# Patient Record
Sex: Male | Born: 1980 | Race: White | Hispanic: No | Marital: Single | Smoking: Current every day smoker
Health system: Southern US, Community
[De-identification: ages and names within clinical notes are randomized; demographics above are authoritative.]

## PROBLEM LIST (undated history)

## (undated) DIAGNOSIS — Z789 Other specified health status: Secondary | ICD-10-CM

## (undated) HISTORY — PX: NO PAST SURGERIES: SHX2092

---

## 2012-12-07 ENCOUNTER — Emergency Department: Payer: Self-pay | Admitting: Emergency Medicine

## 2015-09-19 ENCOUNTER — Encounter: Payer: Self-pay | Admitting: Emergency Medicine

## 2015-09-19 ENCOUNTER — Emergency Department
Admission: EM | Admit: 2015-09-19 | Discharge: 2015-09-19 | Disposition: A | Discharge status: Home / Self Care | Payer: Self-pay | Attending: Emergency Medicine | Admitting: Emergency Medicine

## 2015-09-19 DIAGNOSIS — K529 Noninfective gastroenteritis and colitis, unspecified: Secondary | ICD-10-CM

## 2015-09-19 DIAGNOSIS — R112 Nausea with vomiting, unspecified: Secondary | ICD-10-CM | POA: Insufficient documentation

## 2015-09-19 DIAGNOSIS — R109 Unspecified abdominal pain: Secondary | ICD-10-CM | POA: Insufficient documentation

## 2015-09-19 DIAGNOSIS — R197 Diarrhea, unspecified: Secondary | ICD-10-CM | POA: Insufficient documentation

## 2015-09-19 DIAGNOSIS — K921 Melena: Secondary | ICD-10-CM

## 2015-09-19 LAB — COMPREHENSIVE METABOLIC PANEL
ALT: 15 U/L — AB (ref 17–63)
AST: 15 U/L (ref 15–41)
Albumin: 4.4 g/dL (ref 3.5–5.0)
Alkaline Phosphatase: 68 U/L (ref 38–126)
Anion gap: 7 (ref 5–15)
BUN: 11 mg/dL (ref 6–20)
CHLORIDE: 105 mmol/L (ref 101–111)
CO2: 22 mmol/L (ref 22–32)
CREATININE: 0.76 mg/dL (ref 0.61–1.24)
Calcium: 8.7 mg/dL — ABNORMAL LOW (ref 8.9–10.3)
GFR calc Af Amer: >60 mL/min (ref >60)
GFR calc non Af Amer: >60 mL/min (ref >60)
Glucose, Bld: 115 mg/dL — ABNORMAL HIGH (ref 65–99)
POTASSIUM: 3.6 mmol/L (ref 3.5–5.1)
Sodium: 134 mmol/L — ABNORMAL LOW (ref 135–145)
Total Bilirubin: 0.8 mg/dL (ref 0.3–1.2)
Total Protein: 7.7 g/dL (ref 6.5–8.1)

## 2015-09-19 LAB — CBC
HEMATOCRIT: 48.3 % (ref 40.0–52.0)
Hemoglobin: 16.7 g/dL (ref 13.0–18.0)
MCH: 32.9 pg (ref 26.0–34.0)
MCHC: 34.6 g/dL (ref 32.0–36.0)
MCV: 95.3 fL (ref 80.0–100.0)
PLATELETS: 220 10*3/uL (ref 150–440)
RBC: 5.07 MIL/uL (ref 4.40–5.90)
RDW: 12.8 % (ref 11.5–14.5)
WBC: 13.7 10*3/uL — ABNORMAL HIGH (ref 3.8–10.6)

## 2015-09-19 LAB — LIPASE, BLOOD: Lipase: 18 U/L (ref 11–51)

## 2015-09-19 MED ORDER — SODIUM CHLORIDE 0.9 % IV BOLUS (SEPSIS)
1000.0000 mL | Freq: Once | INTRAVENOUS | Status: AC
  Administered 2015-09-19: 1000 mL via INTRAVENOUS

## 2015-09-19 MED ORDER — ONDANSETRON HCL 4 MG/2ML IJ SOLN
INTRAMUSCULAR | Status: AC
  Administered 2015-09-19: 4 mg via INTRAVENOUS
  Filled 2015-09-19: qty 2

## 2015-09-19 MED ORDER — ONDANSETRON HCL 4 MG/2ML IJ SOLN
4.0000 mg | Freq: Once | INTRAMUSCULAR | Status: AC
  Administered 2015-09-19: 4 mg via INTRAVENOUS

## 2015-09-19 MED ORDER — ONDANSETRON HCL 4 MG PO TABS: 4.0000 mg | ORAL_TABLET | Freq: Every day | ORAL | Status: DC | PRN

## 2015-09-19 NOTE — ED Notes (Signed)
A/o. Lungs clear. Heart sounds WNL

## 2015-09-19 NOTE — ED Provider Notes (Addendum)
[facility] Emergency Department Provider Note  ____________________________________________   I have reviewed the triage vital signs and the nursing notes.   HISTORY  Chief Complaint Abdominal Pain    HPI Joseph Elliott is a 35 y.o. male presents today with nausea vomiting diarrhea of one days duration. Patient had some mild abdominal cramping. No antibiotics no recent travel. Other sick contacts. He has no abdominal pain at this time. He states he did have a "trace" of blood and one bowel movement earlier. He denies any fever or chills. He felt fine yesterday. Not only blood thinners. No family history of ulcerative colitis that he knows of. No history of Crohn's disease. This is not a normal process for him.  Vomited a total of 4 times and had mostly watery diarrhea today. Only one episode of stool had any trace of blood in it    History reviewed. No pertinent past medical history.  There are no active problems to display for this patient.   History reviewed. No pertinent past surgical history.  No current outpatient prescriptions on file.  Allergies Review of patient's allergies indicates no known allergies.  No family history on file.  Social History Social History  Substance Use Topics  . Smoking status: Never Smoker   . Smokeless tobacco: None  . Alcohol Use: No    Review of Systems Constitutional: No fever/chills Eyes: No visual changes. ENT: No sore throat. No stiff neck no neck pain Cardiovascular: Denies chest pain. Respiratory: Denies shortness of breath. Gastrointestinal:  See history of present illness Genitourinary: Negative for dysuria. Musculoskeletal: Negative lower extremity swelling Skin: Negative for rash. Neurological: Negative for headaches, focal weakness or numbness. 10-point ROS otherwise negative.  ____________________________________________   PHYSICAL EXAM:  VITAL SIGNS: ED Triage Vitals  Enc  Vitals Group     BP 09/19/15 1409 136/71 mmHg     Pulse Rate 09/19/15 1409 85     Resp 09/19/15 1409 16     Temp 09/19/15 1409 97.7 F (36.5 C)     Temp Source 09/19/15 1409 Oral     SpO2 09/19/15 1409 99 %     Weight 09/19/15 1409 145 lb (65.772 kg)     Height 09/19/15 1409 5\' 5"  (1.651 m)     Head Cir --      Peak Flow --      Pain Score 09/19/15 1409 8     Pain Loc --      Pain Edu? --      Excl. in GC? --     Constitutional: Alert and oriented. Well appearing and in no acute distress. Eyes: Conjunctivae are normal. PERRL. EOMI. Head: Atraumatic. Nose: No congestion/rhinnorhea. Mouth/Throat: Mucous membranes are moist.  Oropharynx non-erythematous. Neck: No stridor.   Nontender with no meningismus Cardiovascular: Normal rate, regular rhythm. Grossly normal heart sounds.  Good peripheral circulation. Respiratory: Normal respiratory effort.  No retractions. Lungs CTAB. Abdominal: Soft and nontender. No distention. No guarding no rebound Back:  There is no focal tenderness or step off there is no midline tenderness there are no lesions noted. there is no CVA tenderness Musculoskeletal: No lower extremity tenderness. No joint effusions, no DVT signs strong distal pulses no edema Neurologic:  Normal speech and language. No gross focal neurologic deficits are appreciated.  Skin:  Skin is warm, dry and intact. No rash noted. Psychiatric: Mood and affect are normal. Speech and behavior are normal.  ____________________________________________   LABS (all labs ordered are listed,  but only abnormal results are displayed)  Labs Reviewed  COMPREHENSIVE METABOLIC PANEL - Abnormal; Notable for the following:    Sodium 134 (*)    Glucose, Bld 115 (*)    Calcium 8.7 (*)    ALT 15 (*)    All other components within normal limits  CBC - Abnormal; Notable for the following:    WBC 13.7 (*)    All other components within normal limits  LIPASE, BLOOD    ____________________________________________  EKG  I personally interpreted any EKGs ordered by me or triage  ____________________________________________  RADIOLOGY  I reviewed any imaging ordered by me or triage that were performed during my shift and, if possible, patient and/or family made aware of any abnormal findings. ____________________________________________   PROCEDURES  Procedure(s) performed: None  Critical Care performed: None  ____________________________________________   INITIAL IMPRESSION / ASSESSMENT AND PLAN / ED COURSE  Pertinent labs & imaging results that were available during my care of the patient were reviewed by me and considered in my medical decision making (see chart for details).  Very well-appearing gentleman with nausea vomiting diarrhea. Apparently there was some blood earlier but his hemoglobin is normal and his platelet count was normal he has no abdominal tenderness and he has no evidence of any significant intra-abdominal pathology such as diverticulosis etc. Vital signs are reassuring do not see any evidence of any significant GI bleed. We'll give the patient IV fluid and antiemetics and reassess. He will need follow-up with GI.  ----------------------------------------- 4:34 PM on 09/19/2015 -----------------------------------------  Patient feels much better, requesting a work note for tomorrow. We will discharge him at this time with close follow-up with GI because of the reported bleeding. However I do not think antibiotics are indicated at this time for one day of non-travelers diarrhea. Extensive return precautions including for bleeding given and listed. Patient declines rectal exam.  ----------------------------------------- 4:39 PM on 09/19/2015 -----------------------------------------  Patient is feeling much better at this time would like to go home. ____________________________________________   FINAL CLINICAL  IMPRESSION(S) / ED DIAGNOSES  Final diagnoses:  None      This chart was dictated using voice recognition software.  Despite best efforts to proofread,  errors can occur which can change meaning.     Jeanmarie Plant, MD 09/19/15 1526  Jeanmarie Plant, MD 09/19/15 1635  Jeanmarie Plant, MD 09/19/15 [phone]

## 2015-09-19 NOTE — Discharge Instructions (Signed)
Bloody Diarrhea ° ° °Bloody diarrhea can be caused by many different conditions. Most of the time bloody diarrhea is the result of food poisoning or minor infections. Bloody diarrhea usually improves over 2 to 3 days of rest and fluid replacement. Other conditions that can cause bloody diarrhea include: °· Internal bleeding. °· Infection. °· Diseases of the bowel and colon. °Internal bleeding from an ulcer or bowel disease can be severe and requires hospital care or even surgery. °DIAGNOSIS  °To find out what is wrong your caregiver may check your: °· Stool. °· Blood. °· Results from a test that looks inside the body (endoscopy). °TREATMENT  °· Get plenty of rest. °· Drink enough water and fluids to keep your urine clear or pale yellow. °· Do not smoke. °· Solid foods and dairy products should be avoided until your illness improves. °· As you improve, slowly return to a regular diet with easily-digested foods first. Examples are: °¨ Bananas. °¨ Rice. °¨ Toast. °¨ Crackers. °You should only need these for about 2 days before adding more normal foods to your diet. °· Avoid spicy or fatty foods as well as caffeine and alcohol for several days. °· Medicine to control cramping and diarrhea can relieve symptoms but may prolong some cases of bloody diarrhea. Antibiotics can speed recovery from diarrhea due to some bacterial infections. Call your caregiver if diarrhea does not get better in 3 days. °SEEK MEDICAL CARE IF:  °· You do not improve after 3 days. °· Your diarrhea improves but your stool appears black. °SEEK IMMEDIATE MEDICAL CARE IF:  °· You become extremely weak or faint. °· You become very sweaty. °· You have increased pain or bleeding. °· You develop repeated vomiting. °· You vomit and you see blood or the vomit looks black in color. °· You have a fever. °  °This information is not intended to replace advice given to you by your health care provider. Make sure you discuss any questions you have with your  health care provider. °  °Document Released: 05/21/2005 Document Revised: 06/11/2014 Document Reviewed: 04/22/2009 °Elsevier Interactive Patient Education ©2016 [employer] ° °

## 2015-09-19 NOTE — ED Notes (Signed)
Pt presents with abd pain, n/v/d with some rectal bleeding once this am .

## 2017-07-01 ENCOUNTER — Emergency Department
Admission: EM | Admit: 2017-07-01 | Discharge: 2017-07-01 | Disposition: A | Discharge status: Home / Self Care | Payer: Commercial Managed Care - PPO | Attending: Emergency Medicine | Admitting: Emergency Medicine

## 2017-07-01 ENCOUNTER — Encounter: Payer: Self-pay | Admitting: Intensive Care

## 2017-07-01 DIAGNOSIS — M436 Torticollis: Secondary | ICD-10-CM | POA: Insufficient documentation

## 2017-07-01 DIAGNOSIS — F17210 Nicotine dependence, cigarettes, uncomplicated: Secondary | ICD-10-CM | POA: Diagnosis not present

## 2017-07-01 DIAGNOSIS — M79602 Pain in left arm: Secondary | ICD-10-CM | POA: Diagnosis not present

## 2017-07-01 DIAGNOSIS — M542 Cervicalgia: Secondary | ICD-10-CM | POA: Diagnosis present

## 2017-07-01 MED ORDER — KETOROLAC TROMETHAMINE 10 MG PO TABS: 10.0000 mg | ORAL_TABLET | Freq: Three times a day (TID) | ORAL | 0 refills | Status: DC

## 2017-07-01 MED ORDER — METAXALONE 800 MG PO TABS
800.0000 mg | ORAL_TABLET | Freq: Once | ORAL | Status: AC
  Administered 2017-07-01: 800 mg via ORAL
  Filled 2017-07-01: qty 1

## 2017-07-01 MED ORDER — METAXALONE 800 MG PO TABS: 800.0000 mg | ORAL_TABLET | Freq: Three times a day (TID) | ORAL | 0 refills | Status: AC

## 2017-07-01 MED ORDER — KETOROLAC TROMETHAMINE 30 MG/ML IJ SOLN
30.0000 mg | Freq: Once | INTRAMUSCULAR | Status: AC
  Administered 2017-07-01: 30 mg via INTRAMUSCULAR
  Filled 2017-07-01: qty 1

## 2017-07-01 NOTE — ED Triage Notes (Signed)
Patient c/o sharp neck and L shoulder pain that radiates down L arm. Patient also states he has some pain in L chest area. Denies injury

## 2017-07-01 NOTE — Discharge Instructions (Signed)
Your exam is consistent with a muscle strain and spasm to the neck. This is causing some referred pain to the shoulder and arm. Take the prescription meds as directed. Apply moist heat to the neck and upper back. Follow-up with your provider or [facility] as needed.

## 2017-07-01 NOTE — ED Notes (Signed)
See triage note  Having some pain to neck and into left shoulder  Denies any injury  No deformity noted

## 2017-07-01 NOTE — ED Provider Notes (Signed)
[facility] Emergency Department Provider Note ____________________________________________  Time seen: 1322  I have reviewed the triage vital signs and the nursing notes.  HISTORY  Chief Complaint  Neck Pain and Shoulder Pain  HPI Joseph Elliott is a 37 y.o. male presents to the ED, accompanied by his wife, for evaluation of continued left neck pain and spasm.  Patient reports awakening Thursday morning with progressively worsening, left-sided neck pain and stiffness; while sitting down resting.  He is also noted some muscle pain referral to the upper shoulder and upper arm.  He also describes some mild, intermittent, numbness and tingling towards the hand.  He denies any injury, accident, or trauma.  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  He denies any diaphoresis, nausea, or vomiting.  He does report some referral of the shoulder pain into the upper anterior left chest.  He denies any shortness of breath, weakness, or dyspnea with exertion.  He reports his symptoms are improved with resting and limiting the movement of his left upper extremity.  He describes symptoms are worsened with use of the left arm and turning the neck.  He describes the pain is sharp in nature.  He has been evaluated by local urgent care the day after onset.  There he was told he had a muscle strain, and was placed on ibuprofen and Flexeril.  He has those medications as prescribed, but denies any significant benefit.  He presents here with his wife, for continued symptoms and some concern, on her part, that it may be his heart.  Patient otherwise has no significant medical history and takes no daily medications.  History reviewed. No pertinent past medical history.  There are no active problems to display for this patient.  History reviewed. No pertinent surgical history.  Prior to  Admission medications   Medication Sig Start Date End Date Taking? Authorizing Provider  ketorolac (TORADOL) 10 MG tablet Take 1 tablet (10 mg total) by mouth every 8 (eight) hours. 07/01/17   Dylana Shaw, Charlesetta IvoryJenise V Bacon, PA-C  metaxalone (SKELAXIN) 800 MG tablet Take 1 tablet (800 mg total) by mouth 3 (three) times daily for 10 days. 07/01/17 07/11/17  Mera Gunkel, Charlesetta IvoryJenise V Bacon, PA-C  ondansetron (ZOFRAN) 4 MG tablet Take 1 tablet (4 mg total) by mouth daily as needed for nausea or vomiting. 09/19/15   Jeanmarie PlantMcShane, James A, MD    Allergies Patient has no known allergies.  History reviewed. No pertinent family history.  Social History Social History   Tobacco Use  . Smoking status: Current Every Day Smoker    Types: Cigarettes  . Smokeless tobacco: Never Used  Substance Use Topics  . Alcohol use: Yes    Alcohol/week: 8.4 oz    Types: 14 Cans of beer per week  . Drug use: Yes    Types: Marijuana    Review of Systems  Constitutional: Negative for fever. Eyes: Negative for visual changes. ENT: Negative for sore throat. Cardiovascular: Negative for chest pain. Respiratory: Negative for shortness of breath. Gastrointestinal: Negative for abdominal pain, vomiting and diarrhea. Genitourinary: Negative for dysuria. Musculoskeletal: Negative for back pain.  Left neck and shoulder pain as above. Skin: Negative for rash. Neurological: Negative for headaches, focal weakness or numbness. ____________________________________________  PHYSICAL EXAM:  VITAL SIGNS: ED Triage Vitals  Enc Vitals Group     BP 07/01/17 1256 118/84  Pulse Rate 07/01/17 1256 87     Resp 07/01/17 1256 18     Temp 07/01/17 1256 98.3 F (36.8 C)     Temp Source 07/01/17 1256 Oral     SpO2 07/01/17 1256 98 %     Weight 07/01/17 1257 145 lb (65.8 kg)     Height 07/01/17 1257 5\' 5"  (1.651 m)     Head Circumference --      Peak Flow --      Pain Score 07/01/17 1257 10     Pain Loc --      Pain Edu? --      Excl. in  GC? --     Constitutional: Alert and oriented. Well appearing and in no distress. Head: Normocephalic and atraumatic. Eyes: Conjunctivae are normal. PERRL. Normal extraocular movements Ears: Canals clear. TMs intact bilaterally. Nose: No congestion/rhinorrhea/epistaxis. Mouth/Throat: Mucous membranes are moist. Neck: Supple. No thyromegaly.  Patient with tenderness to palpation over the left SCM musculature.  Patient also noted to have decreased right rotation and right lateral bending, both causing increased pain on the left side. Hematological/Lymphatic/Immunological: No cervical lymphadenopathy. Cardiovascular: Normal rate, regular rhythm. Normal distal pulses. Respiratory: Normal respiratory effort. No wheezes/rales/rhonchi. Musculoskeletal: Nontender with normal range of motion in all extremities.  Neurologic: Nerves II through XII grossly normal UE/LE DTRs bilaterally.  Intrinsic and opposition testing.  Normal gait without ataxia. Normal speech and language. No gross focal neurologic deficits are appreciated. Skin:  Skin is warm, dry and intact. No rash noted. Psychiatric: Mood and affect are normal. Patient exhibits appropriate insight and judgment. ____________________________________________   RADIOLOGY  Deferred ____________________________________________  EKG  NSR 77 bpm Incomplete RBBB Normal intervals No STEMI ____________________________________________  PROCEDURES  Procedures Toradol 30 mg IM Skelaxin 800 mg PO ____________________________________________  INITIAL IMPRESSION / ASSESSMENT AND PLAN / ED COURSE  DDX: cervical radiculopathy, ACS, rotator cuff tendinitis, cervical spine strain, torticollis  Patient with ED evaluation of continued left-sided neck and upper extremity pain.  Patient with a normal exam on presentation without signs of any acute neurological deficit, signs of ischemia, or arthropathy. His exam is consistent with acute torticollis. He  and his wife are reassured by his normal EKG. He will be discharged with a prescription for Toradol and Skelaxin. A work note is provided for 2 days, as requested. He will follow-up with [facility], as needed.  ____________________________________________  FINAL CLINICAL IMPRESSION(S) / ED DIAGNOSES  Final diagnoses:  Torticollis, acute  Musculoskeletal arm pain, left      Linnet Bottari, Charlesetta Ivory, PA-C 07/01/17 1459    Rockne Menghini, MD 07/01/17 1544

## 2017-07-01 NOTE — ED Triage Notes (Addendum)
FIRST NURSE NOTE-pt here for neck and shoulder pain. Seen at UC recently and told pulled muscle.  Girlfriend wants him checked out again. No injury.

## 2017-07-12 ENCOUNTER — Emergency Department
Admission: EM | Admit: 2017-07-12 | Discharge: 2017-07-12 | Disposition: A | Discharge status: Home / Self Care | Payer: Commercial Managed Care - PPO | Attending: Emergency Medicine | Admitting: Emergency Medicine

## 2017-07-12 ENCOUNTER — Emergency Department: Payer: Commercial Managed Care - PPO

## 2017-07-12 ENCOUNTER — Other Ambulatory Visit: Payer: Self-pay

## 2017-07-12 DIAGNOSIS — F17210 Nicotine dependence, cigarettes, uncomplicated: Secondary | ICD-10-CM | POA: Insufficient documentation

## 2017-07-12 DIAGNOSIS — M62838 Other muscle spasm: Secondary | ICD-10-CM | POA: Diagnosis not present

## 2017-07-12 DIAGNOSIS — M5412 Radiculopathy, cervical region: Secondary | ICD-10-CM | POA: Diagnosis not present

## 2017-07-12 DIAGNOSIS — M542 Cervicalgia: Secondary | ICD-10-CM | POA: Diagnosis present

## 2017-07-12 MED ORDER — PREDNISONE 50 MG PO TABS: ORAL_TABLET | ORAL | 0 refills | Status: DC

## 2017-07-12 MED ORDER — ORPHENADRINE CITRATE ER 100 MG PO TB12: 100.0000 mg | ORAL_TABLET | Freq: Two times a day (BID) | ORAL | 0 refills | Status: AC | PRN

## 2017-07-12 NOTE — ED Notes (Signed)
Pt reports left shoulder pain x1 week. Pt describes pain beginning in his neck and radiating down his left arm. Pt reports feeling like someone is stabbing him constantly in the shoulder and that he has not been able to sleep well this past week.

## 2017-07-12 NOTE — ED Triage Notes (Signed)
Pt comes for c/o left shoulder and neck pain. Pt states he was seen here recently and has taken prescribed medication with no relief.

## 2017-07-12 NOTE — ED Provider Notes (Signed)
[facility] Emergency Department Provider Note  ____________________________________________  Time seen: Approximately 4:29 PM  I have reviewed the triage vital signs and the nursing notes.   HISTORY  Chief Complaint Shoulder Pain   Historian Mother   HPI Joseph Elliott is a 37 y.o. male presents to the emergency department with neck pain that radiates from the upper trapezius to the arm but stops at the left elbow.  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  He denies numbness, tingling and weakness.  Patient reports that he was previously seen on 07/01/2017 and was diagnosed with torticollis and prescribed Skelaxin and Toradol.  Patient reports no change in his symptoms.  He is also requesting a work note.   History reviewed. No pertinent past medical history.   Immunizations up to date:  Yes.     History reviewed. No pertinent past medical history.  There are no active problems to display for this patient.   History reviewed. No pertinent surgical history.  Prior to Admission medications   Medication Sig Start Date End Date Taking? Authorizing Provider  ketorolac (TORADOL) 10 MG tablet Take 1 tablet (10 mg total) by mouth every 8 (eight) hours. 07/01/17   Menshew, Charlesetta IvoryJenise V Bacon, PA-C  ondansetron (ZOFRAN) 4 MG tablet Take 1 tablet (4 mg total) by mouth daily as needed for nausea or vomiting. 09/19/15   Jeanmarie PlantMcShane, James A, MD  orphenadrine (NORFLEX) 100 MG tablet Take 1 tablet (100 mg total) by mouth 2 (two) times daily as needed for up to 10 days for muscle spasms. 07/12/17 07/22/17  Orvil FeilWoods, Karell Tukes M, PA-C  predniSONE (DELTASONE) 50 MG tablet Take one 50 mg tablet once daily for 5 days. 07/12/17   Orvil FeilWoods, Loras Grieshop M, PA-C    Allergies Patient has no known allergies.  No family history on file.  Social History Social History   Tobacco Use  . Smoking status: Current Every Day Smoker    Types: Cigarettes  . Smokeless  tobacco: Never Used  Substance Use Topics  . Alcohol use: Yes    Alcohol/week: 8.4 oz    Types: 14 Cans of beer per week  . Drug use: Yes    Types: Marijuana     Review of Systems  Constitutional: No fever/chills Eyes:  No discharge ENT: No upper respiratory complaints. Respiratory: no cough. No SOB/ use of accessory muscles to breath Gastrointestinal:   No nausea, no vomiting.  No diarrhea.  No constipation. Musculoskeletal: Patient has neck pain and left arm pain.  Skin: Negative for rash, abrasions, lacerations, ecchymosis.    ____________________________________________   PHYSICAL EXAM:  VITAL SIGNS: ED Triage Vitals  Enc Vitals Group     BP 07/12/17 1328 139/75     Pulse Rate 07/12/17 1328 81     Resp 07/12/17 1328 18     Temp 07/12/17 1328 97.8 F (36.6 C)     Temp Source 07/12/17 1328 Oral     SpO2 07/12/17 1328 99 %     Weight 07/12/17 1330 145 lb (65.8 kg)     Height 07/12/17 1330 5\' 5"  (1.651 m)     Head Circumference --      Peak Flow --      Pain Score 07/12/17 1330 8     Pain Loc --      Pain Edu? --      Excl. in GC? --      Constitutional: Alert and oriented. Well appearing  and in no acute distress. Eyes: Conjunctivae are normal. PERRL. EOMI. Head: Atraumatic. Cardiovascular: Normal rate, regular rhythm. Normal S1 and S2.  Good peripheral circulation. Respiratory: Normal respiratory effort without tachypnea or retractions. Lungs CTAB. Good air entry to the bases with no decreased or absent breath sounds Musculoskeletal: Patient has reproducible pain to palpation along the left upper trapezius.  His left upper extremity pain is worsened with range of motion testing at the neck.  No midline cervical spine tenderness was elicited with palpation.  Palpable radial pulse, bilaterally and symmetrically. Neurologic:  Normal for age. No gross focal neurologic deficits are appreciated.  Skin:  Skin is warm, dry and intact. No rash noted. Psychiatric: Mood  and affect are normal for age. Speech and behavior are normal.   ____________________________________________   LABS (all labs ordered are listed, but only abnormal results are displayed)  Labs Reviewed - No data to display ____________________________________________  EKG   ____________________________________________  RADIOLOGY Geraldo Pitter, personally viewed and evaluated these images (plain radiographs) as part of my medical decision making, as well as reviewing the written report by the radiologist.  Dg Cervical Spine 2-3 Views  Result Date: 07/12/2017 CLINICAL DATA:  Neck pain radiating into the left arm.  No injury. EXAM: CERVICAL SPINE - 2-3 VIEW COMPARISON:  None. FINDINGS: The lateral view is diagnostic to the C7-T1 level. There is no acute fracture or subluxation. Vertebral body heights are preserved. Reversal of the normal cervical lordosis. Alignment is normal. Interveterbral disc spaces are maintained. Normal prevertebral soft tissues. IMPRESSION: Reversal of the normal cervical lordosis, which could reflect muscle spasm. No acute osseous abnormality or significant degenerative changes. Electronically Signed   By: Obie Dredge M.D.   On: 07/12/2017 16:06   Dg Shoulder Left  Result Date: 07/12/2017 CLINICAL DATA:  Left shoulder pain for 1 week.  No injury. EXAM: LEFT SHOULDER - 2+ VIEW COMPARISON:  None. FINDINGS: There is no evidence of fracture or dislocation. There is no evidence of arthropathy or other focal bone abnormality. Soft tissues are unremarkable. IMPRESSION: Negative. Electronically Signed   By: Obie Dredge M.D.   On: 07/12/2017 15:59    ____________________________________________    PROCEDURES  Procedure(s) performed:     Procedures     Medications - No data to display   ____________________________________________   INITIAL IMPRESSION / ASSESSMENT AND PLAN / ED COURSE  Pertinent labs & imaging results that were available during  my care of the patient were reviewed by me and considered in my medical decision making (see chart for details).     Assessment and plan Muscle spasm Patient presents to the emergency department with reproducible pain to palpation along the left upper trapezius.  Differential diagnosis included bony lesion, muscle spasm and radiculopathy.  Patient was treated empirically with prednisone and discharged with Norflex.  Vital signs are reassuring prior to discharge.  All patient questions were answered.   ____________________________________________  FINAL CLINICAL IMPRESSION(S) / ED DIAGNOSES  Final diagnoses:  Cervical radiculopathy  Muscle spasm      NEW MEDICATIONS STARTED DURING THIS VISIT:  ED Discharge Orders        Ordered    predniSONE (DELTASONE) 50 MG tablet     07/12/17 1626    orphenadrine (NORFLEX) 100 MG tablet  2 times daily PRN     07/12/17 1626          This chart was dictated using voice recognition software/Dragon. Despite best efforts to proofread, errors can  occur which can change the meaning. Any change was purely unintentional.     Orvil Feil, PA-C 07/12/17 1635    Minna Antis, MD 07/12/17 2204

## 2018-06-03 ENCOUNTER — Encounter: Payer: Self-pay | Admitting: Emergency Medicine

## 2018-06-03 ENCOUNTER — Emergency Department
Admission: EM | Admit: 2018-06-03 | Discharge: 2018-06-03 | Disposition: A | Discharge status: Home / Self Care | Payer: 59 | Attending: Emergency Medicine | Admitting: Emergency Medicine

## 2018-06-03 ENCOUNTER — Other Ambulatory Visit: Payer: Self-pay

## 2018-06-03 ENCOUNTER — Emergency Department: Payer: 59

## 2018-06-03 DIAGNOSIS — R200 Anesthesia of skin: Secondary | ICD-10-CM | POA: Insufficient documentation

## 2018-06-03 DIAGNOSIS — F1210 Cannabis abuse, uncomplicated: Secondary | ICD-10-CM | POA: Insufficient documentation

## 2018-06-03 DIAGNOSIS — F17210 Nicotine dependence, cigarettes, uncomplicated: Secondary | ICD-10-CM | POA: Insufficient documentation

## 2018-06-03 DIAGNOSIS — R202 Paresthesia of skin: Secondary | ICD-10-CM | POA: Diagnosis not present

## 2018-06-03 DIAGNOSIS — S40021A Contusion of right upper arm, initial encounter: Secondary | ICD-10-CM | POA: Diagnosis not present

## 2018-06-03 DIAGNOSIS — Y9389 Activity, other specified: Secondary | ICD-10-CM | POA: Diagnosis not present

## 2018-06-03 DIAGNOSIS — M21821 Other specified acquired deformities of right upper arm: Secondary | ICD-10-CM | POA: Diagnosis not present

## 2018-06-03 DIAGNOSIS — Y9289 Other specified places as the place of occurrence of the external cause: Secondary | ICD-10-CM | POA: Insufficient documentation

## 2018-06-03 DIAGNOSIS — S4991XA Unspecified injury of right shoulder and upper arm, initial encounter: Secondary | ICD-10-CM | POA: Diagnosis present

## 2018-06-03 DIAGNOSIS — X509XXA Other and unspecified overexertion or strenuous movements or postures, initial encounter: Secondary | ICD-10-CM | POA: Insufficient documentation

## 2018-06-03 DIAGNOSIS — Y998 Other external cause status: Secondary | ICD-10-CM | POA: Diagnosis not present

## 2018-06-03 MED ORDER — HYDROCODONE-ACETAMINOPHEN 5-325 MG PO TABS: 1.0000 | ORAL_TABLET | Freq: Four times a day (QID) | ORAL | 0 refills | Status: DC | PRN

## 2018-06-03 MED ORDER — MELOXICAM 15 MG PO TABS: 15.0000 mg | ORAL_TABLET | Freq: Every day | ORAL | 2 refills | Status: DC

## 2018-06-03 NOTE — Discharge Instructions (Addendum)
Follow-up with orthopedics.  Please call them for an appointment.  Continue to wear the sling due to the instability of the shoulder.  Take the medications as prescribed.

## 2018-06-03 NOTE — ED Triage Notes (Signed)
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  States he felt pain to his right hand.

## 2018-06-03 NOTE — ED Provider Notes (Signed)
[facility] Emergency Department Provider Note  ____________________________________________   First MD Initiated Contact with Patient 06/03/18 [phone]     (approximate)  I have reviewed the triage vital signs and the nursing notes.   HISTORY  Chief Complaint Shoulder Pain    HPI Joseph Elliott is a 37 y.o. male presents to the emergency department stating that he felt like he dislocated his shoulder at work and pushed it back in.  States he is still swollen anteriorly.  States that he had numbness and tingling shooting into his arm when it happened.  States that is better now.  He moves mattresses at [employer]   History reviewed. No pertinent past medical history.  There are no active problems to display for this patient.   History reviewed. No pertinent surgical history.  Prior to Admission medications   Medication Sig Start Date End Date Taking? Authorizing Provider  HYDROcodone-acetaminophen (NORCO/VICODIN) 5-325 MG tablet Take 1 tablet by mouth every 6 (six) hours as needed for moderate pain. 06/03/18   Fisher, Roselyn BeringSusan W, PA-C  meloxicam (MOBIC) 15 MG tablet Take 1 tablet (15 mg total) by mouth daily. 06/03/18 06/03/19  Faythe GheeFisher, Susan W, PA-C    Allergies Patient has no known allergies.  No family history on file.  Social History Social History   Tobacco Use  . Smoking status: Current Every Day Smoker    Types: Cigarettes  . Smokeless tobacco: Never Used  Substance Use Topics  . Alcohol use: Yes    Alcohol/week: 14.0 standard drinks    Types: 14 Cans of beer per week  . Drug use: Yes    Types: Marijuana    Review of Systems  Constitutional: No fever/chills Eyes: No visual changes. ENT: No sore throat. Respiratory: Denies cough Genitourinary: Negative for dysuria. Musculoskeletal: Negative for back pain.  Positive for right shoulder pain Skin: Negative for rash.    ____________________________________________   PHYSICAL  EXAM:  VITAL SIGNS: ED Triage Vitals [06/03/18 0848]  Enc Vitals Group     BP 130/70     Pulse Rate 82     Resp 16     Temp 97.9 F (36.6 C)     Temp Source Oral     SpO2 97 %     Weight 135 lb (61.2 kg)     Height 5\' 5"  (1.651 m)     Head Circumference      Peak Flow      Pain Score 7     Pain Loc      Pain Edu?      Excl. in GC?     Constitutional: Alert and oriented. Well appearing and in no acute distress. Eyes: Conjunctivae are normal.  Head: Atraumatic. Nose: No congestion/rhinnorhea. Mouth/Throat: Mucous membranes are moist.   Neck:  supple no lymphadenopathy noted Cardiovascular: Normal rate, regular rhythm. Heart sounds are normal Respiratory: Normal respiratory effort.  No retractions, lungs c t a  GU: deferred Musculoskeletal: FROM all extremities, warm and well perfused, the right shoulder is swollen anteriorly, neurovascular is intact Neurologic:  Normal speech and language.  Skin:  Skin is warm, dry and intact. No rash noted. Psychiatric: Mood and affect are normal. Speech and behavior are normal.  ____________________________________________   LABS (all labs ordered are listed, but only abnormal results are displayed)  Labs Reviewed - No data to display ____________________________________________   ____________________________________________  RADIOLOGY  X-ray of the right shoulder shows a Hill-Sachs deformity  ____________________________________________   PROCEDURES  Procedure(s) performed: Sling was applied by the tech   Procedures    ____________________________________________   INITIAL IMPRESSION / ASSESSMENT AND PLAN / ED COURSE  Pertinent labs & imaging results that were available during my care of the patient were reviewed by me and considered in my medical decision making (see chart for details).   Patient is 37 year old male presents emergency department complaint of right shoulder pain.  Physical exam shows some  anterior swelling of the right shoulder  X-ray shows a Hill-Sachs deformity  Explained this finding to the patient.  Explained to him that when he reduce the shoulder by himself he bruised the bone and has questionable torn ligaments.  He needs to follow-up with orthopedics.  He is placed in a sling.  Given meloxicam 15 mg daily and Vicodin to be taken at night if he cannot sleep due to the pain.  He states he understands will comply.  He was given a work note stating he cannot use the right arm until he has been seen by orthopedics.  He was discharged in stable condition.     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    Faythe GheeFisher, Susan W, PA-C 06/03/18 1033    Dionne BucySiadecki, Sebastian, MD 06/03/18 1225

## 2018-06-03 NOTE — ED Notes (Addendum)
See triage note  Presents with pain to right shoulder  States he noticed pain after moving furniture last pm  Pain increased when he attempted to move a box this am  Questionable deformity noted  Good pulses

## 2018-06-16 ENCOUNTER — Other Ambulatory Visit: Payer: Self-pay | Admitting: Orthopedic Surgery

## 2018-06-16 DIAGNOSIS — S43004A Unspecified dislocation of right shoulder joint, initial encounter: Secondary | ICD-10-CM

## 2018-06-17 ENCOUNTER — Other Ambulatory Visit: Payer: Self-pay | Admitting: Orthopedic Surgery

## 2018-06-17 ENCOUNTER — Ambulatory Visit
Admission: RE | Admit: 2018-06-17 | Discharge: 2018-06-17 | Disposition: A | Discharge status: Home / Self Care | Payer: PRIVATE HEALTH INSURANCE | Source: Ambulatory Visit | Attending: Orthopedic Surgery | Admitting: Orthopedic Surgery

## 2018-06-17 DIAGNOSIS — S43004A Unspecified dislocation of right shoulder joint, initial encounter: Secondary | ICD-10-CM

## 2018-06-17 MED ORDER — IOPAMIDOL (ISOVUE-300) INJECTION 61%
15.0000 mL | Freq: Once | INTRAVENOUS | Status: AC | PRN
  Administered 2018-06-17: 15 mL

## 2018-06-17 MED ORDER — LIDOCAINE HCL (PF) 1 % IJ SOLN
5.0000 mL | Freq: Once | INTRAMUSCULAR | Status: AC
  Administered 2018-06-17: 10 mL
  Filled 2018-06-17: qty 5

## 2018-06-17 MED ORDER — GADOBUTROL 1 MMOL/ML IV SOLN
0.0500 mL | Freq: Once | INTRAVENOUS | Status: AC | PRN
  Administered 2018-06-17: 0.05 mL

## 2018-06-17 MED ORDER — SODIUM CHLORIDE (PF) 0.9 % IJ SOLN: 10.0000 mL | INTRAMUSCULAR | Status: DC | PRN

## 2018-07-01 ENCOUNTER — Other Ambulatory Visit: Payer: Self-pay

## 2018-07-01 ENCOUNTER — Encounter
Admission: RE | Admit: 2018-07-01 | Discharge: 2018-07-01 | Disposition: A | Discharge status: Home / Self Care | Payer: PRIVATE HEALTH INSURANCE | Source: Ambulatory Visit | Attending: Orthopedic Surgery | Admitting: Orthopedic Surgery

## 2018-07-01 HISTORY — DX: Other specified health status: Z78.9

## 2018-07-01 NOTE — Patient Instructions (Signed)
Your procedure is scheduled on:  07-08-18 TUESDAY Report to Same Day Surgery 2nd floor medical mall [facility](Medical Mall Entrance-take elevator on left to 2nd floor.  Check in with surgery information desk.) To find out your arrival time please call [phone] between 1PM - 3PM on 07-07-18 MONDAY  Remember: Instructions that are not followed completely may result in serious medical risk, up to and including death, or upon the discretion of your surgeon and anesthesiologist your surgery may need to be rescheduled.    _x___ 1. Do not eat food after midnight the night before your procedure. NO GUM OR CANDY AFTER MIDNIGHT.  You may drink clear liquids up to 2 hours before you are scheduled to arrive at the hospital for your procedure.  Do not drink clear liquids within 2 hours of your scheduled arrival to the hospital.  Clear liquids include  --Water or Apple juice without pulp  --Clear carbohydrate beverage such as ClearFast or Gatorade  --Black Coffee or Clear Tea (No milk, no creamers, do not add anything to the coffee or Tea   ____Ensure clear carbohydrate drink on the way to the hospital for bariatric patients  ____Ensure clear carbohydrate drink 3 hours before surgery for Dr Rutherford NailByrnett's patients if physician instructed.     __x__ 2. No Alcohol for 24 hours before or after surgery.   __x__3. No Smoking or e-cigarettes for 24 prior to surgery.  Do not use any chewable tobacco products for at least 6 hour prior to surgery   ____  4. Bring all medications with you on the day of surgery if instructed.    __x__ 5. Notify your doctor if there is any change in your medical condition     (cold, fever, infections).    x___6. On the morning of surgery brush your teeth with toothpaste and water.  You may rinse your mouth with mouth wash if you wish.  Do not swallow any toothpaste or mouthwash.   Do not wear jewelry, make-up, hairpins, clips or nail polish.  Do not wear lotions, powders, or perfumes. You  may wear deodorant.  Do not shave 48 hours prior to surgery. Men may shave face and neck.  Do not bring valuables to the hospital.    [facility] is not responsible for any belongings or valuables.               Contacts, dentures or bridgework may not be worn into surgery.  Leave your suitcase in the car. After surgery it may be brought to your room.  For patients admitted to the hospital, discharge time is determined by your treatment team.  _  Patients discharged the day of surgery will not be allowed to drive home.  You will need someone to drive you home and stay with you the night of your procedure.    Please read over the following fact sheets that you were given:   [facility] Pre[facility]   ____ Take anti-hypertensive listed below, cardiac, seizure, asthma, anti-reflux and psychiatric medicines. These include:  1. NONE  2.  3.  4.  5.  6.  ____Fleets enema or Magnesium Citrate as directed.   _x___ Use CHG Soap or sage wipes as directed on instruction sheet   ____ Use inhalers on the day of surgery and bring to hospital day of surgery  ____ Stop Metformin and Janumet 2 days prior to surgery.    ____ Take 1/2 of usual insulin dose the night before surgery and  none on the morning surgery.   ____ Follow recommendations from Cardiologist, Pulmonologist or PCP regarding  stopping Aspirin, Coumadin, Plavix ,Eliquis, Effient, or Pradaxa, and Pletal.  X____Stop Anti-inflammatories such as Advil, Aleve, Ibuprofen, Motrin, Naproxen, MOBIC, Naprosyn, Goodies powders or aspirin products NOW-OK to take Tylenol    ____ Stop supplements until after surgery.    ____ Bring C-Pap to the hospital.

## 2018-07-02 ENCOUNTER — Encounter
Admission: RE | Admit: 2018-07-02 | Discharge: 2018-07-02 | Disposition: A | Discharge status: Home / Self Care | Payer: 59 | Source: Ambulatory Visit | Attending: Orthopedic Surgery | Admitting: Orthopedic Surgery

## 2018-07-02 DIAGNOSIS — Z01812 Encounter for preprocedural laboratory examination: Secondary | ICD-10-CM | POA: Insufficient documentation

## 2018-07-02 LAB — BASIC METABOLIC PANEL
ANION GAP: 10 (ref 5–15)
BUN: 11 mg/dL (ref 6–20)
CO2: 24 mmol/L (ref 22–32)
Calcium: 9.2 mg/dL (ref 8.9–10.3)
Chloride: 105 mmol/L (ref 98–111)
Creatinine, Ser: 0.78 mg/dL (ref 0.61–1.24)
GFR calc Af Amer: >60 mL/min (ref >60)
GFR calc non Af Amer: >60 mL/min (ref >60)
Glucose, Bld: 115 mg/dL — ABNORMAL HIGH (ref 70–99)
Potassium: 4 mmol/L (ref 3.5–5.1)
SODIUM: 139 mmol/L (ref 135–145)

## 2018-07-02 LAB — CBC WITH DIFFERENTIAL/PLATELET
Abs Immature Granulocytes: 0.01 10*3/uL (ref 0.00–0.07)
Basophils Absolute: 0 10*3/uL (ref 0.0–0.1)
Basophils Relative: 1 %
Eosinophils Absolute: 0.2 10*3/uL (ref 0.0–0.5)
Eosinophils Relative: 3 %
HCT: 51.1 % (ref 39.0–52.0)
Hemoglobin: 17.7 g/dL — ABNORMAL HIGH (ref 13.0–17.0)
IMMATURE GRANULOCYTES: 0 %
LYMPHS PCT: 26 %
Lymphs Abs: 1.5 10*3/uL (ref 0.7–4.0)
MCH: 33.1 pg (ref 26.0–34.0)
MCHC: 34.6 g/dL (ref 30.0–36.0)
MCV: 95.7 fL (ref 80.0–100.0)
Monocytes Absolute: 0.5 10*3/uL (ref 0.1–1.0)
Monocytes Relative: 9 %
NEUTROS PCT: 61 %
Neutro Abs: 3.5 10*3/uL (ref 1.7–7.7)
Platelets: 269 10*3/uL (ref 150–400)
RBC: 5.34 MIL/uL (ref 4.22–5.81)
RDW: 12.7 % (ref 11.5–15.5)
WBC: 5.8 10*3/uL (ref 4.0–10.5)
nRBC: 0 % (ref 0.0–0.2)

## 2018-07-02 LAB — PROTIME-INR
INR: 0.89
PROTHROMBIN TIME: 12 s (ref 11.4–15.2)

## 2018-07-02 LAB — APTT: aPTT: 28 seconds (ref 24–36)

## 2018-07-07 MED ORDER — CEFAZOLIN SODIUM-DEXTROSE 2-4 GM/100ML-% IV SOLN
2.0000 g | INTRAVENOUS | Status: AC
  Administered 2018-07-08: 2 g via INTRAVENOUS

## 2018-07-08 ENCOUNTER — Ambulatory Visit: Payer: 59 | Admitting: Anesthesiology

## 2018-07-08 ENCOUNTER — Encounter
Admission: RE | Disposition: A | Discharge status: Home / Self Care | Payer: Self-pay | Source: Home / Self Care | Attending: Orthopedic Surgery

## 2018-07-08 ENCOUNTER — Ambulatory Visit
Admission: RE | Admit: 2018-07-08 | Discharge: 2018-07-08 | Disposition: A | Discharge status: Home / Self Care | Payer: 59 | Attending: Orthopedic Surgery | Admitting: Orthopedic Surgery

## 2018-07-08 DIAGNOSIS — X58XXXA Exposure to other specified factors, initial encounter: Secondary | ICD-10-CM | POA: Insufficient documentation

## 2018-07-08 DIAGNOSIS — M25311 Other instability, right shoulder: Secondary | ICD-10-CM | POA: Insufficient documentation

## 2018-07-08 DIAGNOSIS — F17210 Nicotine dependence, cigarettes, uncomplicated: Secondary | ICD-10-CM | POA: Insufficient documentation

## 2018-07-08 DIAGNOSIS — S43491A Other sprain of right shoulder joint, initial encounter: Secondary | ICD-10-CM | POA: Diagnosis not present

## 2018-07-08 HISTORY — PX: SHOULDER ARTHROSCOPY WITH ROTATOR CUFF REPAIR: SHX5685

## 2018-07-08 LAB — URINE DRUG SCREEN, QUALITATIVE (ARMC ONLY)
Amphetamines, Ur Screen: NOT DETECTED
Barbiturates, Ur Screen: NOT DETECTED
Benzodiazepine, Ur Scrn: NOT DETECTED
Cannabinoid 50 Ng, Ur ~~LOC~~: POSITIVE — AB
Cocaine Metabolite,Ur ~~LOC~~: NOT DETECTED
MDMA (ECSTASY) UR SCREEN: NOT DETECTED
Methadone Scn, Ur: NOT DETECTED
Opiate, Ur Screen: NOT DETECTED
Phencyclidine (PCP) Ur S: NOT DETECTED
Tricyclic, Ur Screen: NOT DETECTED

## 2018-07-08 SURGERY — ARTHROSCOPY, SHOULDER, WITH ROTATOR CUFF REPAIR
Anesthesia: Regional | Site: Shoulder | Laterality: Right

## 2018-07-08 MED ORDER — FENTANYL CITRATE (PF) 100 MCG/2ML IJ SOLN
INTRAMUSCULAR | Status: AC
  Administered 2018-07-08: 50 ug via INTRAVENOUS
  Filled 2018-07-08: qty 2

## 2018-07-08 MED ORDER — FAMOTIDINE 20 MG PO TABS
20.0000 mg | ORAL_TABLET | Freq: Once | ORAL | Status: AC
  Administered 2018-07-08: 20 mg via ORAL

## 2018-07-08 MED ORDER — DEXAMETHASONE SODIUM PHOSPHATE 10 MG/ML IJ SOLN
INTRAMUSCULAR | Status: AC
  Filled 2018-07-08: qty 1

## 2018-07-08 MED ORDER — LIDOCAINE HCL (CARDIAC) PF 100 MG/5ML IV SOSY
PREFILLED_SYRINGE | INTRAVENOUS | Status: DC | PRN
  Administered 2018-07-08: 60 mg via INTRAVENOUS
  Administered 2018-07-08: 50 mg via INTRAVENOUS

## 2018-07-08 MED ORDER — LIDOCAINE HCL (PF) 1 % IJ SOLN
INTRAMUSCULAR | Status: DC | PRN
  Administered 2018-07-08: 1 mL

## 2018-07-08 MED ORDER — DEXAMETHASONE SODIUM PHOSPHATE 10 MG/ML IJ SOLN
INTRAMUSCULAR | Status: DC | PRN
  Administered 2018-07-08: 10 mg via INTRAVENOUS

## 2018-07-08 MED ORDER — FENTANYL CITRATE (PF) 100 MCG/2ML IJ SOLN
INTRAMUSCULAR | Status: DC | PRN
  Administered 2018-07-08 (×2): 50 ug via INTRAVENOUS

## 2018-07-08 MED ORDER — LACTATED RINGERS IV SOLN
INTRAVENOUS | Status: DC | PRN
  Administered 2018-07-08: 10 mL

## 2018-07-08 MED ORDER — FAMOTIDINE 20 MG PO TABS
ORAL_TABLET | ORAL | Status: AC
  Administered 2018-07-08: 20 mg via ORAL
  Filled 2018-07-08: qty 1

## 2018-07-08 MED ORDER — PROPOFOL 10 MG/ML IV BOLUS
INTRAVENOUS | Status: AC
  Filled 2018-07-08: qty 40

## 2018-07-08 MED ORDER — LIDOCAINE HCL (PF) 2 % IJ SOLN
INTRAMUSCULAR | Status: AC
  Filled 2018-07-08: qty 10

## 2018-07-08 MED ORDER — MIDAZOLAM HCL 2 MG/2ML IJ SOLN
2.0000 mg | Freq: Once | INTRAMUSCULAR | Status: AC
  Administered 2018-07-08: 2 mg via INTRAVENOUS

## 2018-07-08 MED ORDER — LIDOCAINE HCL (PF) 1 % IJ SOLN
INTRAMUSCULAR | Status: AC
  Filled 2018-07-08: qty 5

## 2018-07-08 MED ORDER — SODIUM CHLORIDE 0.9 % IV SOLN
INTRAVENOUS | Status: DC | PRN
  Administered 2018-07-08: 25 ug/min via INTRAVENOUS

## 2018-07-08 MED ORDER — OXYCODONE HCL 5 MG PO TABS: 5.0000 mg | ORAL_TABLET | ORAL | 0 refills | Status: DC | PRN

## 2018-07-08 MED ORDER — ONDANSETRON HCL 4 MG/2ML IJ SOLN
INTRAMUSCULAR | Status: AC
  Filled 2018-07-08: qty 2

## 2018-07-08 MED ORDER — BUPIVACAINE LIPOSOME 1.3 % IJ SUSP
INTRAMUSCULAR | Status: DC | PRN
  Administered 2018-07-08 (×4): 5 mL

## 2018-07-08 MED ORDER — HYDROMORPHONE HCL 1 MG/ML IJ SOLN: 0.2500 mg | INTRAMUSCULAR | Status: DC | PRN

## 2018-07-08 MED ORDER — MIDAZOLAM HCL 2 MG/2ML IJ SOLN
INTRAMUSCULAR | Status: AC
  Administered 2018-07-08: 2 mg via INTRAVENOUS
  Filled 2018-07-08: qty 2

## 2018-07-08 MED ORDER — BUPIVACAINE LIPOSOME 1.3 % IJ SUSP
INTRAMUSCULAR | Status: AC
  Filled 2018-07-08: qty 20

## 2018-07-08 MED ORDER — ROCURONIUM BROMIDE 50 MG/5ML IV SOLN
INTRAVENOUS | Status: AC
  Filled 2018-07-08: qty 1

## 2018-07-08 MED ORDER — ONDANSETRON HCL 4 MG/2ML IJ SOLN
INTRAMUSCULAR | Status: DC | PRN
  Administered 2018-07-08: 4 mg via INTRAVENOUS

## 2018-07-08 MED ORDER — CHLORHEXIDINE GLUCONATE CLOTH 2 % EX PADS: 6.0000 | MEDICATED_PAD | Freq: Once | CUTANEOUS | Status: DC

## 2018-07-08 MED ORDER — BUPIVACAINE HCL (PF) 0.5 % IJ SOLN
INTRAMUSCULAR | Status: DC | PRN
  Administered 2018-07-08 (×2): 5 mL

## 2018-07-08 MED ORDER — PHENYLEPHRINE HCL 10 MG/ML IJ SOLN
INTRAMUSCULAR | Status: DC | PRN
  Administered 2018-07-08: 2 ug via INTRAVENOUS

## 2018-07-08 MED ORDER — FENTANYL CITRATE (PF) 100 MCG/2ML IJ SOLN
INTRAMUSCULAR | Status: AC
  Filled 2018-07-08: qty 2

## 2018-07-08 MED ORDER — LACTATED RINGERS IV SOLN
INTRAVENOUS | Status: DC
  Administered 2018-07-08: 07:00:00 via INTRAVENOUS

## 2018-07-08 MED ORDER — ONDANSETRON HCL 4 MG PO TABS: 4.0000 mg | ORAL_TABLET | Freq: Three times a day (TID) | ORAL | 0 refills | Status: DC | PRN

## 2018-07-08 MED ORDER — ROCURONIUM BROMIDE 100 MG/10ML IV SOLN
INTRAVENOUS | Status: DC | PRN
  Administered 2018-07-08: 50 mg via INTRAVENOUS

## 2018-07-08 MED ORDER — BUPIVACAINE HCL (PF) 0.5 % IJ SOLN
INTRAMUSCULAR | Status: AC
  Filled 2018-07-08: qty 10

## 2018-07-08 MED ORDER — EPHEDRINE SULFATE 50 MG/ML IJ SOLN
INTRAMUSCULAR | Status: DC | PRN
  Administered 2018-07-08 (×2): 10 mg via INTRAVENOUS
  Administered 2018-07-08: 5 mg via INTRAVENOUS

## 2018-07-08 MED ORDER — CEFAZOLIN SODIUM-DEXTROSE 2-4 GM/100ML-% IV SOLN
INTRAVENOUS | Status: AC
  Filled 2018-07-08: qty 100

## 2018-07-08 MED ORDER — EPINEPHRINE 30 MG/30ML IJ SOLN
INTRAMUSCULAR | Status: AC
  Filled 2018-07-08: qty 1

## 2018-07-08 MED ORDER — PROPOFOL 10 MG/ML IV BOLUS
INTRAVENOUS | Status: DC | PRN
  Administered 2018-07-08: 180 mg via INTRAVENOUS

## 2018-07-08 MED ORDER — BUPIVACAINE HCL (PF) 0.25 % IJ SOLN
INTRAMUSCULAR | Status: AC
  Filled 2018-07-08: qty 30

## 2018-07-08 MED ORDER — FENTANYL CITRATE (PF) 100 MCG/2ML IJ SOLN
50.0000 ug | Freq: Once | INTRAMUSCULAR | Status: AC
  Administered 2018-07-08: 50 ug via INTRAVENOUS

## 2018-07-08 SURGICAL SUPPLY — 64 items
ADAPTER IRRIG TUBE 2 SPIKE SOL (ADAPTER) ×6 IMPLANT
ANCHOR SUT BIOC ST 3X145 (Anchor) ×9 IMPLANT
BUR RADIUS 4.0X18.5 (BURR) ×3 IMPLANT
BUR RADIUS 5.5 (BURR) ×3 IMPLANT
CANISTER SUCT LVC 12 LTR MEDI- (MISCELLANEOUS) IMPLANT
CANNULA 5.75X7 CRYSTAL CLEAR (CANNULA) ×6 IMPLANT
CANNULA PARTIAL THREAD 2X7 (CANNULA) ×3 IMPLANT
CANNULA TWIST IN 8.25X9CM (CANNULA) IMPLANT
CLOSURE WOUND 1/2 X4 (GAUZE/BANDAGES/DRESSINGS) ×1
COOLER POLAR GLACIER W/PUMP (MISCELLANEOUS) ×3 IMPLANT
COVER WAND RF STERILE (DRAPES) ×3 IMPLANT
CRADLE LAMINECT ARM (MISCELLANEOUS) ×3 IMPLANT
DEVICE SUCT BLK HOLE OR FLOOR (MISCELLANEOUS) ×3 IMPLANT
DRAPE IMP U-DRAPE 54X76 (DRAPES) ×6 IMPLANT
DRAPE INCISE IOBAN 66X45 STRL (DRAPES) ×3 IMPLANT
DRAPE SHEET LG 3/4 BI-LAMINATE (DRAPES) ×3 IMPLANT
DRAPE U-SHAPE 47X51 STRL (DRAPES) IMPLANT
DURAPREP 26ML APPLICATOR (WOUND CARE) ×12 IMPLANT
ELECT REM PT RETURN 9FT ADLT (ELECTROSURGICAL)
ELECTRODE REM PT RTRN 9FT ADLT (ELECTROSURGICAL) IMPLANT
GAUZE PETRO XEROFOAM 1X8 (MISCELLANEOUS) ×3 IMPLANT
GAUZE SPONGE 4X4 12PLY STRL (GAUZE/BANDAGES/DRESSINGS) ×3 IMPLANT
GLOVE BIOGEL PI IND STRL 9 (GLOVE) ×1 IMPLANT
GLOVE BIOGEL PI INDICATOR 9 (GLOVE) ×2
GLOVE SURG 9.0 ORTHO LTXF (GLOVE) ×6 IMPLANT
GOWN STRL REUS TWL 2XL XL LVL4 (GOWN DISPOSABLE) ×3 IMPLANT
GOWN STRL REUS W/ TWL LRG LVL3 (GOWN DISPOSABLE) ×3 IMPLANT
GOWN STRL REUS W/ TWL LRG LVL4 (GOWN DISPOSABLE) ×1 IMPLANT
GOWN STRL REUS W/TWL LRG LVL3 (GOWN DISPOSABLE) ×6
GOWN STRL REUS W/TWL LRG LVL4 (GOWN DISPOSABLE) ×2
IV LACTATED RINGER IRRG 3000ML (IV SOLUTION) ×20
IV LR IRRIG 3000ML ARTHROMATIC (IV SOLUTION) ×10 IMPLANT
KIT STABILIZATION SHOULDER (MISCELLANEOUS) ×3 IMPLANT
KIT SUTURETAK 3.0 INSERT PERC (KITS) ×3 IMPLANT
KIT TURNOVER KIT A (KITS) ×3 IMPLANT
MANIFOLD NEPTUNE II (INSTRUMENTS) ×3 IMPLANT
MASK FACE SPIDER DISP (MASK) ×3 IMPLANT
MAT ABSORB  FLUID 56X50 GRAY (MISCELLANEOUS) ×4
MAT ABSORB FLUID 56X50 GRAY (MISCELLANEOUS) ×2 IMPLANT
NEEDLE HYPO 22GX1.5 SAFETY (NEEDLE) ×3 IMPLANT
NS IRRIG 500ML POUR BTL (IV SOLUTION) ×3 IMPLANT
PACK ARTHROSCOPY SHOULDER (MISCELLANEOUS) ×3 IMPLANT
PAD WRAPON POLAR SHDR XLG (MISCELLANEOUS) ×1 IMPLANT
SET TUBE SUCT SHAVER OUTFL 24K (TUBING) ×3 IMPLANT
SET TUBE TIP INTRA-ARTICULAR (MISCELLANEOUS) ×3 IMPLANT
STRAP SAFETY 5IN WIDE (MISCELLANEOUS) ×3 IMPLANT
STRIP CLOSURE SKIN 1/2X4 (GAUZE/BANDAGES/DRESSINGS) ×2 IMPLANT
SUT ETHILON 4-0 (SUTURE) ×2
SUT ETHILON 4-0 FS2 18XMFL BLK (SUTURE) ×1
SUT LASSO 90 DEG SD STR (SUTURE) ×3 IMPLANT
SUT MNCRL 4-0 (SUTURE) ×2
SUT MNCRL 4-0 27XMFL (SUTURE) ×1
SUT PDS AB 0 CT1 27 (SUTURE) IMPLANT
SUT VIC AB 0 CT1 36 (SUTURE) IMPLANT
SUT VIC AB 2-0 CT2 27 (SUTURE) IMPLANT
SUTURE ETHLN 4-0 FS2 18XMF BLK (SUTURE) ×1 IMPLANT
SUTURE MAGNUM WIRE 2X48 BLK (SUTURE) IMPLANT
SUTURE MNCRL 4-0 27XMF (SUTURE) ×1 IMPLANT
TAPE MICROFOAM 4IN (TAPE) ×3 IMPLANT
TUBING ARTHRO INFLOW-ONLY STRL (TUBING) ×3 IMPLANT
TUBING CONNECTING 10 (TUBING) ×2 IMPLANT
TUBING CONNECTING 10' (TUBING) ×1
WAND HAND CNTRL MULTIVAC 90 (MISCELLANEOUS) ×3 IMPLANT
WRAPON POLAR PAD SHDR XLG (MISCELLANEOUS) ×3

## 2018-07-08 NOTE — Op Note (Signed)
07/08/2018  10:11 AM  PATIENT:  Joseph Elliott  38 y.o. male  PRE-OPERATIVE DIAGNOSIS:  RIGHT SHOULDER RECURRENT INSTABILITY  POST-OPERATIVE DIAGNOSIS:  RIGHT SHOULDER ANTERIOR LABRAL TEAR  PROCEDURE:  Procedure(s): RIGHT SHOULDER ARTHROSCOPY WITH ANTERIOR LABRAL REPAIR (Right)  SURGEON:  Surgeon(s) and Role:    * Thornton Park, MD - Primary  ANESTHESIA:   general and paracervical block   PREOPERATIVE INDICATIONS:  Joseph Elliott is a  38 y.o. male with a diagnosis of RIGHT SHOULDER RECURRENT INSTABILITY who failed conservative measures and elected for surgical management.    I discussed the risks and benefits of surgery. The risks include but are not limited to infection, bleeding, nerve or blood vessel injury, joint stiffness or loss of motion, persistent pain, weakness or instability, and hardware failure and the need for further surgery. Medical risks include but are not limited to DVT and pulmonary embolism, myocardial infarction, stroke, pneumonia, respiratory failure and death. Patient understood these risks and wished to proceed.   OPERATIVE IMPLANTS: Arthrex bio suturetak anchors  3   OPERATIVE FINDINGS:  Right shoulder anterior labral tear with recurrent instability  OPERATIVE PROCEDURE:  I met with the patient in the preoperative area.  I signed the right shoulder according the hospital's correct site of surgery protocol.  I answered all questions by the family. Patient was brought to the operating room. He underwent general endotracheal intubation. He was then positioned in a beachchair position. All bony prominences were adequately padded including the lower extremities.  Examination under anesthesia revealed increased anterior translation of the humeral head with load and shift testing but a negative sulcus sign.  The patient was then prepped and draped in a sterile fashion. The patient received 2 g of Ancef prior to the onset of the case.  A timeout was performed to  verify the patient's name, date of birth, medical record number, correct site of surgery and correct procedure to be performed.The timeout was also used to verify the patient received antibiotics that all appropriate instruments, implants and radiographic studies were available in the room. Once all in attendance were in agreement case began.  Bony landmarks were drawn out with a surgical marker along with proposed incisions. These were pre-injected with 1% lidocaine plain. An 11 blade was used to establish a posterior portal through which the arthroscope was placed in the glenohumeral joint. An anterior portal was established under direct visualization using an 18-gauge spinal needle for localization. A 5.75 mm arthroscopic cannula was inserted through the anterior portal. A full diagnostic examination of the glenohumeral joint was performed.  Findings on arthroscopy included a torn anterior labrum from the 3:00 to 6 o'clock position.  There is no evidence of SLAP tear.  The rotator cuff was intact, included the subscapularis.  There is no tear of the biceps tendon.  There was significant fraying of the posterior labrum without detachment from the glenoid.  There were no significant focal chondral lesions of the humeral head or glenoid.  Patient had a small Hill-Sachs lesion which was not engaging.  He had a patulous capsule.  Manual manipulation of the humeral head under direct visualization showed significant anterior subluxation.  An anterolateral portal was established again under direct visualization using an 18-gauge spinal needle. A 7 mm cannula was placed through this anterolateral portal.  The labrum was mobilized using an arthroscopic elevator.  The interval between the glenoid and labrum was then debrided using a 4.0 mm resector shaver blade until punctate bleeding was  identified.    An Arthrex [employer] anchor was then placed at approximately the 5 o'clock position..  A single limb of this  anchor was then brought out the anterior portal.  A 90 degree Arthrex suture lasso was then passed under the anterior labrum, taking some capsule as well to allow for reduction and capsular volume.  The wire from the 90 degree suture lasso was then also brought out through the anterior portal.  The suture the anterior portal was loaded through the suture lasso and then passed under the capsular labral complex. An arthroscopic knot tying technique was then used to complete the capsulorrhaphy anteriorly, bringing the capsule, labrum and glenoid into approximation.  This process was then repeated to allow 2 additional Arthrex bio suture tack anchors to be placed at the 4:00 and 3:00 positions as well.  Once all anchors were sutured down, the repair was probed and felt to be very stable.  Manual manipulation of the humeral head no longer demonstrated subluxation anteriorly.  The scope was then placed through the anterior portal to look more closely at the posterior labrum.  This showed significant fraying of the posterior labrum but no detachment.  The frayed edges of the posterior labrum were debrided with a 4.0 mm resector shaver blade.  The glenohumeral joint was then copiously irrigated.  All arthroscopic instruments were removed. The three arthroscopic portals were closed with 4-0 nylon. A dry, sterile dressing was applied to the right shoulder, along with a Polar Care sleeve. Patient's right arm was then placed in an abduction sling. He was awoken and brought to PACU in stable condition. I was scrubbed and present for the entire case and all sharp and instrument counts were correct at the conclusion the case. I spoke with the patient's acquaintance in the postop consultation room to let her know the operation was successful and performed without complication.      Timoteo Gaul, MD

## 2018-07-08 NOTE — Anesthesia Post-op Follow-up Note (Signed)
Anesthesia QCDR form completed.        

## 2018-07-08 NOTE — Anesthesia Procedure Notes (Signed)
Anesthesia Regional Block: Interscalene brachial plexus block   Pre-Anesthetic Checklist: ,, timeout performed, Correct Patient, Correct Site, Correct Laterality, Correct Procedure, Correct Position, site marked, Risks and benefits discussed,  Surgical consent,  Pre-op evaluation,  At surgeon's request and post-op pain management  Laterality: Right  Prep: chloraprep       Needles:   Needle Type: Stimiplex     Needle Length: 9cm  Needle Gauge: 21     Additional Needles:   Procedures:,,,, ultrasound used (permanent image in chart),,,,  Narrative:   Performed by: Personally  Anesthesiologist: Jovita Gamma, MD  Additional Notes: Negative paresthesia on injection.  Negative aspiration.  Dose given in divided aliquots under ultrasound guidance.

## 2018-07-08 NOTE — Transfer of Care (Signed)
Immediate Anesthesia Transfer of Care Note  Patient: Joseph Elliott  Procedure(s) Performed: SHOULDER ARTHROSCOPY WITH ANTERIOR LABRAL REPAIR (Right Shoulder)  Patient Location: PACU  Anesthesia Type:General  Level of Consciousness: awake, alert  and oriented  Airway & Oxygen Therapy: Patient Spontanous Breathing and Patient connected to face mask oxygen  Post-op Assessment: Report given to RN and Post -op Vital signs reviewed and stable  Post vital signs: Reviewed and stable  Last Vitals:  Vitals Value Taken Time  BP 142/99 07/08/2018 10:09 AM  Temp    Pulse 105 07/08/2018 10:09 AM  Resp 36 07/08/2018 10:09 AM  SpO2 100 % 07/08/2018 10:09 AM  Vitals shown include unvalidated device data.  Last Pain:  Vitals:   07/08/18 0755  PainSc: 0-No pain         Complications: No apparent anesthesia complications

## 2018-07-08 NOTE — Anesthesia Procedure Notes (Signed)
Procedure Name: Intubation Date/Time: 07/08/2018 8:27 AM Performed by: Danelle Berry, CRNA Pre-anesthesia Checklist: Patient identified, Suction available, Emergency Drugs available, Patient being monitored and Timeout performed Patient Re-evaluated:Patient Re-evaluated prior to induction Oxygen Delivery Method: Circle system utilized and Simple face mask Preoxygenation: Pre-oxygenation with 100% oxygen Induction Type: IV induction Ventilation: Mask ventilation without difficulty Grade View: Grade I Tube type: Oral Tube size: 7.5 mm Number of attempts: 1 Airway Equipment and Method: Stylet Placement Confirmation: ETT inserted through vocal cords under direct vision,  positive ETCO2 and breath sounds checked- equal and bilateral Secured at: 22 cm Tube secured with: Tape Dental Injury: Teeth and Oropharynx as per pre-operative assessment

## 2018-07-08 NOTE — Anesthesia Preprocedure Evaluation (Addendum)
Anesthesia Evaluation  Patient identified by MRN, date of birth, ID band Patient awake    Reviewed: Allergy & Precautions, H&P , NPO status , Patient's Chart, lab work & pertinent test results  Airway Mallampati: III  TM Distance: >3 FB     Dental  (+) Chipped   Pulmonary Current Smoker,           Cardiovascular negative cardio ROS       Neuro/Psych negative neurological ROS  negative psych ROS   GI/Hepatic negative GI ROS, Neg liver ROS, (+)     substance abuse  marijuana use,   Endo/Other  negative endocrine ROS  Renal/GU      Musculoskeletal   Abdominal   Peds  Hematology negative hematology ROS (+)   Anesthesia Other Findings Past Medical History: No date: Medical history non-contributory  Past Surgical History: No date: NO PAST SURGERIES  BMI    Body Mass Index:  22.45 kg/m      Reproductive/Obstetrics negative OB ROS                            Anesthesia Physical Anesthesia Plan  ASA: II  Anesthesia Plan: General ETT and Regional   Post-op Pain Management:    Induction:   PONV Risk Score and Plan: Ondansetron, Dexamethasone, Midazolam and Treatment may vary due to age or medical condition  Airway Management Planned:   Additional Equipment:   Intra-op Plan:   Post-operative Plan:   Informed Consent: I have reviewed the patients History and Physical, chart, labs and discussed the procedure including the risks, benefits and alternatives for the proposed anesthesia with the patient or authorized representative who has indicated his/her understanding and acceptance.     Dental Advisory Given  Plan Discussed with: Anesthesiologist and CRNA  Anesthesia Plan Comments:         Anesthesia Quick Evaluation

## 2018-07-08 NOTE — Discharge Instructions (Signed)

## 2018-07-08 NOTE — H&P (Signed)
PREOPERATIVE H&P  Chief Complaint: RIGHT SHOULDER JOINT INSTABILITY  HPI: Joseph Elliott is a 38 y.o. male who presents for preoperative history and physical with a diagnosis of RIGHT SHOULDER JOINT INSTABILITY.  Patient estimates he has had approximately 10-15 dislocations of the right shoulder.  Symptoms of instability are rated  have been worsening.  The instability significantly impairs activities of daily living and his ability to perform at work..  He has elected for surgical management.   Past Medical History:  Diagnosis Date  . Medical history non-contributory    Past Surgical History:  Procedure Laterality Date  . NO PAST SURGERIES     Social History   Socioeconomic History  . Marital status: Single    Spouse name: Not on file  . Number of children: Not on file  . Years of education: Not on file  . Highest education level: Not on file  Occupational History  . Not on file  Social Needs  . Financial resource strain: Not on file  . Food insecurity:    Worry: Not on file    Inability: Not on file  . Transportation needs:    Medical: Not on file    Non-medical: Not on file  Tobacco Use  . Smoking status: Current Every Day Smoker    Packs/day: 1.00    Years: 13.00    Pack years: 13.00    Types: Cigarettes  . Smokeless tobacco: Never Used  Substance and Sexual Activity  . Alcohol use: Yes    Alcohol/week: 14.0 standard drinks    Types: 14 Cans of beer per week    Comment: 5-6 beer qd  . Drug use: Yes    Types: Marijuana    Comment: qd  . Sexual activity: Not on file  Lifestyle  . Physical activity:    Days per week: Not on file    Minutes per session: Not on file  . Stress: Not on file  Relationships  . Social connections:    Talks on phone: Not on file    Gets together: Not on file    Attends religious service: Not on file    Active member of club or organization: Not on file    Attends meetings of clubs or organizations: Not on file    Relationship  status: Not on file  Other Topics Concern  . Not on file  Social History Narrative  . Not on file   History reviewed. No pertinent family history. No Known Allergies Prior to Admission medications   Medication Sig Start Date End Date Taking? Authorizing Provider  HYDROcodone-acetaminophen (NORCO/VICODIN) 5-325 MG tablet Take 1 tablet by mouth every 6 (six) hours as needed for moderate pain. Patient not taking: Reported on 07/01/2018 06/03/18   Faythe GheeFisher, Susan W, PA-C  meloxicam (MOBIC) 15 MG tablet Take 1 tablet (15 mg total) by mouth daily. Patient not taking: Reported on 07/08/2018 06/03/18 06/03/19  Faythe GheeFisher, Susan W, PA-C     Positive ROS: All other systems have been reviewed and were otherwise negative with the exception of those mentioned in the HPI and as above.  Physical Exam: General: Alert, no acute distress Cardiovascular: Regular rate and rhythm, no murmurs rubs or gallops.  No pedal edema Respiratory: Clear to auscultation bilaterally, no wheezes rales or rhonchi. No cyanosis, no use of accessory musculature GI: No organomegaly, abdomen is soft and non-tender nondistended with positive bowel sounds. Skin: Skin intact, no lesions within the operative field. Neurologic: Sensation intact distally Psychiatric: Patient is  competent for consent with normal mood and affect Lymphatic: No cervical lymphadenopathy  MUSCULOSKELETAL: Right shoulder: Patient skin is intact.  He has no erythema ecchymosis swelling or deformity.  Patient demonstrates no muscle atrophy.  He has positive apprehension.  Patient guards during exam and difficult to assess the humeral head excursion.  Patient is neurovascular intact with palpable radial pulse, intact sensation light touch throughout the right upper extremity.  He has no weakness of the rotator cuff on exam.  Assessment: RIGHT SHOULDER JOINT INSTABILITY  Plan: Plan for Procedure(s): RIGHT SHOULDER ARTHROSCOPIC CAPSULORRHAPHY  I have discussed  the details of the operation as well as the postoperative course with the patient.   I discussed the risks and benefits of surgery. The risks include but are not limited to infection, bleeding, nerve or blood vessel injury, joint stiffness or loss of motion, persistent pain, weakness or instability,and hardware failure and the need for further surgery. Medical risks include but are not limited to DVT and pulmonary embolism, myocardial infarction, stroke, pneumonia, respiratory failure and death. Patient understood these risks and wished to proceed.    Juanell Fairly, MD   07/08/2018 7:51 AM

## 2018-07-08 NOTE — Progress Notes (Signed)
Right hand pink, warm to touch.  Patient has sensation in his hands and can move his fingers wnl.  Patient denies Any pain at this time.

## 2018-07-09 NOTE — Anesthesia Postprocedure Evaluation (Signed)
Anesthesia Post Note  Patient: Joseph Elliott  Procedure(s) Performed: SHOULDER ARTHROSCOPY WITH ANTERIOR LABRAL REPAIR (Right Shoulder)  Patient location during evaluation: PACU Anesthesia Type: Regional Level of consciousness: awake and alert Pain management: pain level controlled Vital Signs Assessment: post-procedure vital signs reviewed and stable Respiratory status: spontaneous breathing, nonlabored ventilation and respiratory function stable Cardiovascular status: blood pressure returned to baseline and stable Postop Assessment: no apparent nausea or vomiting Anesthetic complications: no     Last Vitals:  Vitals:   07/08/18 1056 07/08/18 1129  BP: 129/78 (!) 121/93  Pulse: 83 83  Resp: 16   Temp: (!) 36.3 C   SpO2: 97% 97%    Last Pain:  Vitals:   07/08/18 1056  TempSrc: Temporal  PainSc: 0-No pain                 Jovita Gamma

## 2019-04-23 ENCOUNTER — Emergency Department
Admission: EM | Admit: 2019-04-23 | Discharge: 2019-04-23 | Disposition: A | Discharge status: Home / Self Care | Payer: 59 | Attending: Emergency Medicine | Admitting: Emergency Medicine

## 2019-04-23 ENCOUNTER — Other Ambulatory Visit: Payer: Self-pay

## 2019-04-23 ENCOUNTER — Emergency Department: Payer: 59

## 2019-04-23 DIAGNOSIS — Z20828 Contact with and (suspected) exposure to other viral communicable diseases: Secondary | ICD-10-CM | POA: Insufficient documentation

## 2019-04-23 DIAGNOSIS — J209 Acute bronchitis, unspecified: Secondary | ICD-10-CM

## 2019-04-23 DIAGNOSIS — F17210 Nicotine dependence, cigarettes, uncomplicated: Secondary | ICD-10-CM | POA: Insufficient documentation

## 2019-04-23 DIAGNOSIS — Z20822 Contact with and (suspected) exposure to covid-19: Secondary | ICD-10-CM

## 2019-04-23 LAB — SARS CORONAVIRUS 2 (TAT 6-24 HRS): SARS Coronavirus 2: NEGATIVE

## 2019-04-23 MED ORDER — AZITHROMYCIN 250 MG PO TABS: ORAL_TABLET | ORAL | 0 refills | Status: DC

## 2019-04-23 MED ORDER — ALBUTEROL SULFATE HFA 108 (90 BASE) MCG/ACT IN AERS
10.0000 | INHALATION_SPRAY | Freq: Once | RESPIRATORY_TRACT | Status: AC
  Administered 2019-04-23: 10 via RESPIRATORY_TRACT
  Filled 2019-04-23: qty 6.7

## 2019-04-23 MED ORDER — PREDNISONE 10 MG (21) PO TBPK: ORAL_TABLET | ORAL | 0 refills | Status: DC

## 2019-04-23 MED ORDER — GUAIFENESIN ER 600 MG PO TB12: 600.0000 mg | ORAL_TABLET | Freq: Two times a day (BID) | ORAL | 2 refills | Status: AC

## 2019-04-23 NOTE — ED Notes (Signed)
Message sent to pharmacy for inhaler 

## 2019-04-23 NOTE — ED Provider Notes (Signed)
[facility] Emergency Department Provider Note  ____________________________________________   First MD Initiated Contact with Patient 04/23/19 1307     (approximate)  I have reviewed the triage vital signs and the nursing notes.   HISTORY  Chief Complaint URI    HPI Joseph Elliott is a 38 y.o. male presents emergency department complaining of cough, congestion, body aches, no known fever.  States he is wheezing more.  Some sore throat and congestion.  His sense of taste and smell come and go.  Unsure of Covid exposure.  Patient states he was supposed to go to physical therapy today but is concerned due to his symptoms.    Past Medical History:  Diagnosis Date  . Medical history non-contributory     There are no active problems to display for this patient.   Past Surgical History:  Procedure Laterality Date  . NO PAST SURGERIES    . SHOULDER ARTHROSCOPY WITH ROTATOR CUFF REPAIR Right 07/08/2018   Procedure: SHOULDER ARTHROSCOPY WITH ANTERIOR LABRAL REPAIR;  Surgeon: Thornton Park, MD;  Location: [facility] ORS;  Service: Orthopedics;  Laterality: Right;    Prior to Admission medications   Medication Sig Start Date End Date Taking? Authorizing Provider  azithromycin (ZITHROMAX Z-PAK) 250 MG tablet 2 pills today then 1 pill a day for 4 days 04/23/19   Caryn Section, Linden Dolin, PA-C  guaiFENesin (MUCINEX) 600 MG 12 hr tablet Take 1 tablet (600 mg total) by mouth 2 (two) times daily. 04/23/19 04/22/20  Deirdre Gryder, Linden Dolin, PA-C  predniSONE (STERAPRED UNI-PAK 21 TAB) 10 MG (21) TBPK tablet Take 6 pills on day one then decrease by 1 pill each day 04/23/19   Versie Starks, PA-C    Allergies Patient has no known allergies.  No family history on file.  Social History Social History   Tobacco Use  . Smoking status: Current Every Day Smoker    Packs/day: 1.00    Years: 13.00    Pack years: 13.00    Types: Cigarettes  . Smokeless tobacco: Never Used  Substance  Use Topics  . Alcohol use: Yes    Alcohol/week: 14.0 standard drinks    Types: 14 Cans of beer per week    Comment: 5-6 beer qd  . Drug use: Yes    Types: Marijuana    Comment: qd    Review of Systems  Constitutional: No fever/chills Eyes: No visual changes. ENT: No sore throat.  Positive URI symptoms Respiratory: Positive cough and wheezing Genitourinary: Negative for dysuria. Musculoskeletal: Negative for back pain. Skin: Negative for rash.    ____________________________________________   PHYSICAL EXAM:  VITAL SIGNS: ED Triage Vitals  Enc Vitals Group     BP 04/23/19 1230 136/90     Pulse Rate 04/23/19 1230 71     Resp 04/23/19 1230 17     Temp 04/23/19 1231 97.8 F (36.6 C)     Temp Source 04/23/19 1230 Oral     SpO2 04/23/19 1230 98 %     Weight 04/23/19 1232 153 lb (69.4 kg)     Height 04/23/19 1232 5\' 5"  (1.651 m)     Head Circumference --      Peak Flow --      Pain Score 04/23/19 1232 8     Pain Loc --      Pain Edu? --      Excl. in [place]? --     Constitutional: Alert and oriented. Well appearing and in no acute distress.  Eyes: Conjunctivae are normal.  Head: Atraumatic. Nose: No congestion/rhinnorhea. Mouth/Throat: Mucous membranes are moist.   Neck:  supple no lymphadenopathy noted Cardiovascular: Normal rate, regular rhythm. Heart sounds are normal Respiratory: Normal respiratory effort.  No retractions, lungs with wheezing bilaterally Abd: soft nontender bs normal all 4 quad GU: deferred Musculoskeletal: FROM all extremities, warm and well perfused Neurologic:  Normal speech and language.  Skin:  Skin is warm, dry and intact. No rash noted. Psychiatric: Mood and affect are normal. Speech and behavior are normal.  ____________________________________________   LABS (all labs ordered are listed, but only abnormal results are displayed)  Labs Reviewed  SARS CORONAVIRUS 2 (TAT 6-24 HRS)   ____________________________________________    ____________________________________________  RADIOLOGY  Chest x-ray is normal per the radiologist, does show some markings that appear to be Covid like to me.  ____________________________________________   PROCEDURES  Procedure(s) performed: No  Procedures    ____________________________________________   INITIAL IMPRESSION / ASSESSMENT AND PLAN / ED COURSE  Pertinent labs & imaging results that were available during my care of the patient were reviewed by me and considered in my medical decision making (see chart for details).   Patient is 38 year old male presents emergency department with concerns for Covid.  Physical exam shows patient to appear well.  Vitals normal.  Lungs with wheezing bilaterally.  Chest x-ray read as normal by the radiologist, I have concerns that there Covid markings noted.  Explained the findings to the patient.  Covid test was sent to the lab for the 6 to 24-hour results.  He was given a prescription for Z-Pak, steroid pack, and albuterol inhaler.  He is to also buy over-the-counter Mucinex.  If his test is positive he should quarantine for an additional 10 to 14 days.  Explained to him that if he is positive he should discuss this with the physical therapist to determine at what point they would be comfortable with him returning for therapy.  He does not work so at this point we do not need to worry about his employment.  He was discharged in stable condition with strict instructions to return if worsening.    Joseph Elliott was evaluated in Emergency Department on 04/23/2019 for the symptoms described in the history of present illness. He was evaluated in the context of the global COVID-19 pandemic, which necessitated consideration that the patient might be at risk for infection with the SARS-CoV-2 virus that causes COVID-19. Institutional protocols and algorithms that pertain to the evaluation of patients at risk for COVID-19 are in a state of rapid  change based on information released by regulatory bodies including the CDC and federal and state organizations. These policies and algorithms were followed during the patient's care in the ED.   As part of my medical decision making, I reviewed the following data within the electronic MEDICAL RECORD NUMBER Nursing notes reviewed and incorporated, Old chart reviewed, Radiograph reviewed chest x-ray with questionable Covid, Notes from prior ED visits and [place] Controlled Substance Database  ____________________________________________   FINAL CLINICAL IMPRESSION(S) / ED DIAGNOSES  Final diagnoses:  Suspected COVID-19 virus infection  Acute bronchitis, unspecified organism      NEW MEDICATIONS STARTED DURING THIS VISIT:  Discharge Medication List as of 04/23/2019  2:11 PM    START taking these medications   Details  azithromycin (ZITHROMAX Z-PAK) 250 MG tablet 2 pills today then 1 pill a day for 4 days, Normal    guaiFENesin (MUCINEX) 600 MG 12 hr tablet  Take 1 tablet (600 mg total) by mouth 2 (two) times daily., Starting Thu 04/23/2019, Until Fri 04/22/2020, Normal    predniSONE (STERAPRED UNI-PAK 21 TAB) 10 MG (21) TBPK tablet Take 6 pills on day one then decrease by 1 pill each day, Normal         Note:  This document was prepared using Dragon voice recognition software and may include unintentional dictation errors.    Faythe GheeFisher, Saraiah Bhat W, PA-C 04/23/19 1435    Shaune PollackIsaacs, [place], MD 04/23/19 2256

## 2019-04-23 NOTE — Discharge Instructions (Signed)
Follow-up with your regular doctor if not improving in 3 days.  Return emergency department worsening.  Take medications as prescribed.  Tylenol or ibuprofen if you develop fever.  You should not go to physical therapy until you have received your test results.  If they are positive you need to call them and discuss when it would be appropriate for you to return.  If negative you may return to your next scheduled visit.

## 2019-04-23 NOTE — ED Notes (Signed)
X-ray at bedside

## 2019-04-23 NOTE — ED Notes (Signed)
Pt states he has had a sore throat, body aches, and nasal congestion x2 days- pt denies n/v/d and fevers- pt states he has had "a little" loss of taste

## 2019-04-23 NOTE — ED Triage Notes (Signed)
Pt c/o sinus and chest congestion with sore throat and bodyaches for the past 2 days. Pt is ambulatory to triage with a steady gait in NAD.Marland Kitchen

## 2019-06-10 IMAGING — RF DG ARTHROGRAM SHOULDER*R*
1 series · 1 of 1 positions shown · non-contrast
Comparison: Right shoulder series 06/03/2018.

INDICATION: Right shoulder pain.  History of dislocation.

EXAM:
RIGHT SHOULDER INJECTION FOR MRI ARTHROGRAM- FLUOROSCOPIC GUIDANCE

[Series 1: cp_standard · 0.20mm/px · 1 of 1 slices shown]
[im 1/1]
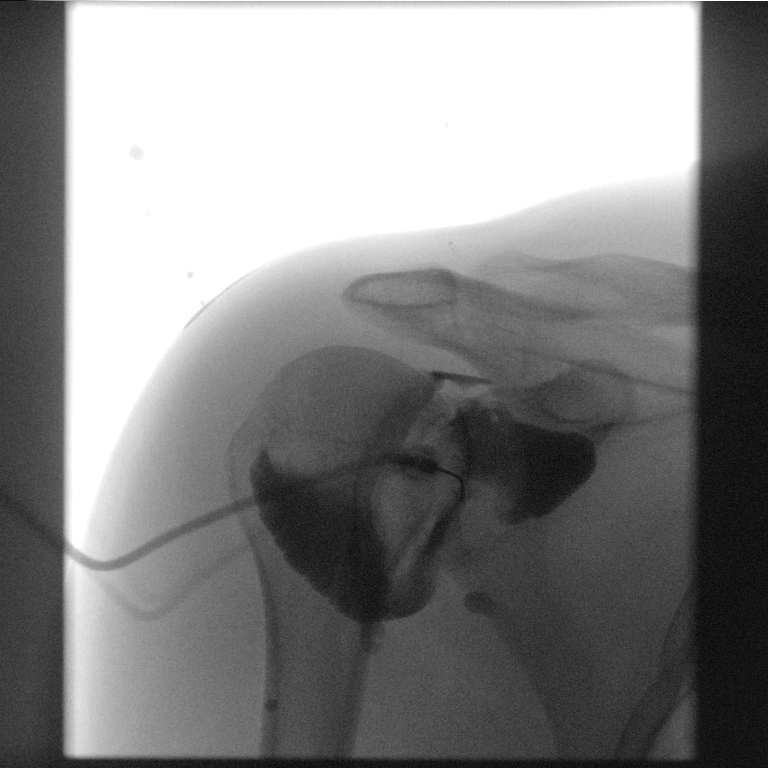

[1 of 1 positions shown; findings below may reference images not displayed]

FLUOROSCOPY TIME:  Fluoroscopy Time:  1 minutes 12 seconds

Radiation Exposure Index (if provided by the fluoroscopic device):
2.8 mGy

Number of Acquired Spot Images: 1

COMPLICATIONS:
None immediate.

PROCEDURE:
After discussing risks and benefits of this procedure the patient
informed consent was obtained. Right shoulder sterilely prepped and
draped. Following local anesthesia 1% lidocaine a standard right
shoulder injection performed using standardized mixture of Isovue
300, Gadavist, and 1 % lidocaine. There no complications. Patient
sent to MRI in stable condition
IMPRESSION: Successful fluoroscopically guided right shoulder injection for MRI
arthrogram.

## 2020-07-28 ENCOUNTER — Emergency Department
Admission: EM | Admit: 2020-07-28 | Discharge: 2020-07-28 | Disposition: A | Discharge status: Home / Self Care | Payer: PRIVATE HEALTH INSURANCE | Attending: Emergency Medicine | Admitting: Emergency Medicine

## 2020-07-28 ENCOUNTER — Encounter: Payer: Self-pay | Admitting: Emergency Medicine

## 2020-07-28 ENCOUNTER — Other Ambulatory Visit: Payer: Self-pay

## 2020-07-28 DIAGNOSIS — F17210 Nicotine dependence, cigarettes, uncomplicated: Secondary | ICD-10-CM | POA: Diagnosis not present

## 2020-07-28 DIAGNOSIS — L723 Sebaceous cyst: Secondary | ICD-10-CM | POA: Diagnosis present

## 2020-07-28 DIAGNOSIS — L089 Local infection of the skin and subcutaneous tissue, unspecified: Secondary | ICD-10-CM

## 2020-07-28 MED ORDER — SULFAMETHOXAZOLE-TRIMETHOPRIM 800-160 MG PO TABS
1.0000 | ORAL_TABLET | Freq: Once | ORAL | Status: AC
  Administered 2020-07-28: 1 via ORAL
  Filled 2020-07-28: qty 1

## 2020-07-28 MED ORDER — SULFAMETHOXAZOLE-TRIMETHOPRIM 800-160 MG PO TABS: 1.0000 | ORAL_TABLET | Freq: Two times a day (BID) | ORAL | 0 refills | Status: DC

## 2020-07-28 MED ORDER — LIDOCAINE HCL (PF) 1 % IJ SOLN
5.0000 mL | Freq: Once | INTRAMUSCULAR | Status: AC
  Administered 2020-07-28: 5 mL
  Filled 2020-07-28: qty 5

## 2020-07-28 NOTE — Discharge Instructions (Signed)
Keep the area clean, dry, and covered. Apply warm compresses over the dressing to promote healing. Take the antibiotic as directed. Return as needed.

## 2020-07-28 NOTE — ED Triage Notes (Signed)
ARrives with c/o cyst / abscess to top of back.  States it has been there 'for years' but recently has gotten larger and having some pain.  Patient states he applied heat to area and cyst has drained, just wants to be checked.

## 2020-07-28 NOTE — ED Provider Notes (Signed)
[facility] Emergency Department Provider Note ____________________________________________  Time seen: 1203  I have reviewed the triage vital signs and the nursing notes.  HISTORY  Chief Complaint  Abscess  HPI Joseph Elliott is a 40 y.o. male presents himself to the ED for evaluation of a chronic cyst, that is now inflamed. He notes for the last week, the cyst on his upper back has been red, tender and inflamed. He applied warm compresses to it yesterday and noted spontaneous drainage. He denies fevers, chills, or sweats.   Past Medical History:  Diagnosis Date  . Medical history non-contributory     There are no problems to display for this patient.   Past Surgical History:  Procedure Laterality Date  . NO PAST SURGERIES    . SHOULDER ARTHROSCOPY WITH ROTATOR CUFF REPAIR Right 07/08/2018   Procedure: SHOULDER ARTHROSCOPY WITH ANTERIOR LABRAL REPAIR;  Surgeon: Juanell Fairly, MD;  Location: [facility] ORS;  Service: Orthopedics;  Laterality: Right;    Prior to Admission medications   Medication Sig Start Date End Date Taking? Authorizing Provider  sulfamethoxazole-trimethoprim (BACTRIM DS) 800-160 MG tablet Take 1 tablet by mouth 2 (two) times daily. 07/28/20  Yes Franchon Ketterman, Charlesetta Ivory, PA-C    Allergies Patient has no known allergies.  History reviewed. No pertinent family history.  Social History Social History   Tobacco Use  . Smoking status: Current Every Day Smoker    Packs/day: 1.00    Years: 13.00    Pack years: 13.00    Types: Cigarettes  . Smokeless tobacco: Never Used  Vaping Use  . Vaping Use: Some days  . Substances: Nicotine  Substance Use Topics  . Alcohol use: Yes    Alcohol/week: 14.0 standard drinks    Types: 14 Cans of beer per week    Comment: 5-6 beer qd  . Drug use: Yes    Types: Marijuana    Comment: qd    Review of Systems  Constitutional: Negative for fever. Cardiovascular: Negative for chest  pain. Respiratory: Negative for shortness of breath. Gastrointestinal: Negative for abdominal pain, vomiting and diarrhea. Genitourinary: Negative for dysuria. Musculoskeletal: Negative for back pain. Skin: Negative for rash. Infected sebaceous cyst  Neurological: Negative for headaches, focal weakness or numbness. ____________________________________________  PHYSICAL EXAM:  VITAL SIGNS: ED Triage Vitals  Enc Vitals Group     BP 07/28/20 1134 (!) 143/109     Pulse Rate 07/28/20 1134 89     Resp 07/28/20 1134 16     Temp 07/28/20 1134 98.7 F (37.1 C)     Temp Source 07/28/20 1134 Oral     SpO2 07/28/20 1134 99 %     Weight 07/28/20 1133 154 lb 5.2 oz (70 kg)     Height 07/28/20 1133 5\' 5"  (1.651 m)     Head Circumference --      Peak Flow --      Pain Score 07/28/20 1132 0     Pain Loc --      Pain Edu? --      Excl. in [place]? --     Constitutional: Alert and oriented. Well appearing and in no distress. Head: Normocephalic and atraumatic. Eyes: Conjunctivae are normal. Normal extraocular movements Cardiovascular: Normal rate, regular rhythm. Normal distal pulses. Respiratory: Normal respiratory effort. Musculoskeletal: Nontender with normal range of motion in all extremities.  Neurologic:  Normal gait without ataxia. Normal speech and language. No gross focal neurologic deficits are appreciated. Skin:  Skin is warm, dry  and intact. No rash noted. Infected, erythematous sebaceous cyst of the left upper back. A local scab is noted. No spontaneous drainage is appreciated  Psychiatric: Mood and affect are normal. Patient exhibits appropriate insight and judgment. ____________________________________________  PROCEDURES  Bactrim DS i PO  .Marland KitchenIncision and Drainage  Date/Time: 07/28/2020 12:54 PM Performed by: Lissa Hoard, PA-C Authorized by: Lissa Hoard, PA-C   Consent:    Consent obtained:  Verbal   Consent given by:  Patient   Risks discussed:   Bleeding, incomplete drainage, pain and damage to other organs   Alternatives discussed:  No treatment Universal protocol:    Procedure explained and questions answered to patient or proxy's satisfaction: yes     Relevant documents present and verified: yes     Test results available : yes     Imaging studies available: yes     Required blood products, implants, devices, and special equipment available: yes     Site/side marked: yes     Immediately prior to procedure, a time out was called: yes     Patient identity confirmed:  Verbally with patient Location:    Type:  Cyst   Size:  2   Location:  Trunk   Trunk location:  Back Pre-procedure details:    Skin preparation:  Betadine and povidone-iodine Sedation:    Sedation type:  None Anesthesia:    Anesthesia method:  Local infiltration   Local anesthetic:  Lidocaine 1% w/o epi Procedure type:    Complexity:  Complex Procedure details:    Incision types:  Single straight   Incision depth:  Subcutaneous   Scalpel blade:  11   Wound management:  Probed and deloculated, irrigated with saline and extensive cleaning   Drainage:  Purulent   Drainage amount:  Moderate   Packing materials:  1/4 in gauze and 1/4 in iodoform gauze   Amount 1/4":  6 Post-procedure details:    Procedure completion:  Tolerated well, no immediate complications   ____________________________________________  INITIAL IMPRESSION / ASSESSMENT AND PLAN / ED COURSE  Patient with incision & drainage of an infected sebaceous cyst of the back. The patient is discharged with wound care instructions and supplies. He will follow-up in 3 days with his PCP or urgent care for wound check and packing removal. A prescription for Bactrim is provided.    Joseph Elliott was evaluated in Emergency Department on 07/28/2020 for the symptoms described in the history of present illness. He was evaluated in the context of the global COVID-19 pandemic, which necessitated  consideration that the patient might be at risk for infection with the SARS-CoV-2 virus that causes COVID-19. Institutional protocols and algorithms that pertain to the evaluation of patients at risk for COVID-19 are in a state of rapid change based on information released by regulatory bodies including the CDC and federal and state organizations. These policies and algorithms were followed during the patient's care in the ED. ____________________________________________  FINAL CLINICAL IMPRESSION(S) / ED DIAGNOSES  Final diagnoses:  Infected sebaceous cyst      Lissa Hoard, PA-C 07/28/20 1320    Jene Every, MD 07/28/20 1343

## 2022-01-26 ENCOUNTER — Emergency Department: Payer: 59

## 2022-01-26 ENCOUNTER — Other Ambulatory Visit: Payer: Self-pay

## 2022-01-26 ENCOUNTER — Emergency Department
Admission: EM | Admit: 2022-01-26 | Discharge: 2022-01-26 | Disposition: A | Discharge status: Home / Self Care | Payer: 59 | Attending: Emergency Medicine | Admitting: Emergency Medicine

## 2022-01-26 DIAGNOSIS — M7918 Myalgia, other site: Secondary | ICD-10-CM | POA: Diagnosis not present

## 2022-01-26 DIAGNOSIS — M436 Torticollis: Secondary | ICD-10-CM | POA: Diagnosis present

## 2022-01-26 LAB — COMPREHENSIVE METABOLIC PANEL
ALT: 25 U/L (ref 0–44)
AST: 22 U/L (ref 15–41)
Albumin: 4.5 g/dL (ref 3.5–5.0)
Alkaline Phosphatase: 69 U/L (ref 38–126)
Anion gap: 8 (ref 5–15)
BUN: 10 mg/dL (ref 6–20)
CO2: 23 mmol/L (ref 22–32)
Calcium: 9.1 mg/dL (ref 8.9–10.3)
Chloride: 107 mmol/L (ref 98–111)
Creatinine, Ser: 0.9 mg/dL (ref 0.61–1.24)
GFR, Estimated: >60 mL/min (ref >60)
Glucose, Bld: 124 mg/dL — ABNORMAL HIGH (ref 70–99)
Potassium: 4.3 mmol/L (ref 3.5–5.1)
Sodium: 138 mmol/L (ref 135–145)
Total Bilirubin: 0.7 mg/dL (ref 0.3–1.2)
Total Protein: 8.2 g/dL — ABNORMAL HIGH (ref 6.5–8.1)

## 2022-01-26 LAB — CBC WITH DIFFERENTIAL/PLATELET
Abs Immature Granulocytes: 0.02 10*3/uL (ref 0.00–0.07)
Basophils Absolute: 0 10*3/uL (ref 0.0–0.1)
Basophils Relative: 1 %
Eosinophils Absolute: 0.1 10*3/uL (ref 0.0–0.5)
Eosinophils Relative: 2 %
HCT: 48.5 % (ref 39.0–52.0)
Hemoglobin: 16.7 g/dL (ref 13.0–17.0)
Immature Granulocytes: 0 %
Lymphocytes Relative: 35 %
Lymphs Abs: 2.5 10*3/uL (ref 0.7–4.0)
MCH: 32.9 pg (ref 26.0–34.0)
MCHC: 34.4 g/dL (ref 30.0–36.0)
MCV: 95.5 fL (ref 80.0–100.0)
Monocytes Absolute: 0.6 10*3/uL (ref 0.1–1.0)
Monocytes Relative: 8 %
Neutro Abs: 3.9 10*3/uL (ref 1.7–7.7)
Neutrophils Relative %: 54 %
Platelets: 274 10*3/uL (ref 150–400)
RBC: 5.08 MIL/uL (ref 4.22–5.81)
RDW: 12.8 % (ref 11.5–15.5)
WBC: 7.1 10*3/uL (ref 4.0–10.5)
nRBC: 0 % (ref 0.0–0.2)

## 2022-01-26 LAB — CK: Total CK: 51 U/L (ref 49–397)

## 2022-01-26 LAB — TROPONIN I (HIGH SENSITIVITY): Troponin I (High Sensitivity): 5 ng/L (ref <18)

## 2022-01-26 MED ORDER — KETOROLAC TROMETHAMINE 10 MG PO TABS: 10.0000 mg | ORAL_TABLET | Freq: Four times a day (QID) | ORAL | 0 refills | Status: AC | PRN

## 2022-01-26 MED ORDER — KETOROLAC TROMETHAMINE 30 MG/ML IJ SOLN
30.0000 mg | Freq: Once | INTRAMUSCULAR | Status: AC
  Administered 2022-01-26: 30 mg via INTRAMUSCULAR
  Filled 2022-01-26: qty 1

## 2022-01-26 NOTE — ED Notes (Signed)
Blood sent

## 2022-01-26 NOTE — Discharge Instructions (Addendum)
I think you pulled your muscles and that is what is causing the pain.  There is no sign of any heart injury or broken bones on the chest x-ray there is no sign of pneumonia either.  I will give you some Toradol.  You can take 1 pill every 6 hours with something on your stomach.  Try to keep something on your stomach when you are taking it because it slight Motrin only much stronger and can irritate your stomach.  Please return for any fever or shortness of breath or increasing pain.  You can also use heat to the area.  Heating pad or warm washcloth may help.  Just be careful not to fall asleep on a heating pad because you can get burns that way.  Ice is another thing that may help.  If you use ice make sure to have a towel between you and the ice and again do not leave it on while you are sleeping because it can also give you burns or frostbite.

## 2022-01-26 NOTE — ED Triage Notes (Signed)
Pt presents to ED with c/o of waking up with a stiff neck. Pt states pain radiated to R chest. Pt denies N/V. Pt denies injury or trauma. Pt is A&Ox4.

## 2022-01-26 NOTE — ED Provider Notes (Signed)
[facility] Provider Note    Event Date/Time   First MD Initiated Contact with Patient 01/26/22 1403     (approximate)   History   Torticollis   HPI  Joseph Elliott is a 41 y.o. male complains of pain in his right pectoral area and between the scapula and spine in his back on the right side.  Palpation of the area makes it worse.  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  He has been having some pain in that area for about 2 days now possibly slightly longer it hurts to move or bend.      Physical Exam   Triage Vital Signs: ED Triage Vitals  Enc Vitals Group     BP 01/26/22 1333 (!) 161/96     Pulse Rate 01/26/22 1333 83     Resp 01/26/22 1333 17     Temp 01/26/22 1333 98.6 F (37 C)     Temp Source 01/26/22 1333 Oral     SpO2 01/26/22 1333 95 %     Weight 01/26/22 1404 154 lb 5.2 oz (70 kg)     Height 01/26/22 1404 5\' 5"  (1.651 m)     Head Circumference --      Peak Flow --      Pain Score 01/26/22 1334 5     Pain Loc --      Pain Edu? --      Excl. in GC? --     Most recent vital signs: Vitals:   01/26/22 1333 01/26/22 1530  BP: (!) 161/96 117/84  Pulse: 83 (!) 54  Resp: 17 17  Temp: 98.6 F (37 C)   SpO2: 95% 96%     General: Awake, no distress.  CV:  Good peripheral perfusion.  Heart regular rate and rhythm no audible murmurs Resp:  Normal effort.  Lungs are clear Chest and back as noted in HPI tender in those 2 areas there is some palpable muscle tenseness in the area of the rhomboids on the right. Abd:  No distention.  Nontender Extremities with no edema   ED Results / Procedures / Treatments   Labs (all labs ordered are listed, but only abnormal results are displayed) Labs Reviewed  COMPREHENSIVE METABOLIC PANEL - Abnormal; Notable for the following components:      Result Value   Glucose, Bld 124 (*)    Total Protein 8.2 (*)    All other  components within normal limits  CBC WITH DIFFERENTIAL/PLATELET  CK  TROPONIN I (HIGH SENSITIVITY)     EKG  EKG read interpreted by me shows normal sinus rhythm rate of 78 normal axis essentially normal EKG except for incomplete right bundle branch block with a QRS duration of 106 ms it was 102 ms in 2019.   RADIOLOGY Chest x-ray read by radiology reviewed and interpreted by me shows no acute disease   PROCEDURES:  Critical Care performed:   Procedures   MEDICATIONS ORDERED IN ED: Medications  ketorolac (TORADOL) 30 MG/ML injection 30 mg (30 mg Intramuscular Given 01/26/22 1417)     IMPRESSION / MDM / ASSESSMENT AND PLAN / ED COURSE  I reviewed the triage vital signs and the nursing notes. Patient's pain somewhat improved with Toradol.  We will give him some more to take with food by mouth.  Patient's symptoms are most consistent with muscle strain.  There is  no sign of pleurisy or PE or pneumonia or heart trouble.  No rib fractures were seen either.  It is possible he may have an occult 1 but without any history of injury it is unlikely.  Patient's presentation is most consistent with acute complicated illness / injury requiring diagnostic workup.  The patient is on the cardiac monitor to evaluate for evidence of arrhythmia and/or significant heart rate changes.  None were seen.  Clinical Course as of 01/26/22 1917  Fri Jan 26, 2022  1525 Chloride: 107 [PM]    Clinical Course User Index [PM] Arnaldo Natal, MD     FINAL CLINICAL IMPRESSION(S) / ED DIAGNOSES   Final diagnoses:  Musculoskeletal pain     Rx / DC Orders   ED Discharge Orders          Ordered    ketorolac (TORADOL) 10 MG tablet  Every 6 hours PRN        01/26/22 1610             Note:  This document was prepared using Dragon voice recognition software and may include unintentional dictation errors.   Arnaldo Natal, MD 01/26/22 [phone]

## 2022-06-04 DIAGNOSIS — E785 Hyperlipidemia, unspecified: Secondary | ICD-10-CM

## 2022-06-04 HISTORY — DX: Hyperlipidemia, unspecified: E78.5

## 2022-08-29 ENCOUNTER — Encounter: Payer: Self-pay | Admitting: Nurse Practitioner

## 2022-08-29 ENCOUNTER — Ambulatory Visit (INDEPENDENT_AMBULATORY_CARE_PROVIDER_SITE_OTHER): Payer: 59 | Admitting: Nurse Practitioner

## 2022-08-29 VITALS — BP 116/68 | HR 93 | Temp 99.6°F | Resp 16 | Ht 65.0 in | Wt 150.4 lb

## 2022-08-29 DIAGNOSIS — Z8489 Family history of other specified conditions: Secondary | ICD-10-CM

## 2022-08-29 DIAGNOSIS — N529 Male erectile dysfunction, unspecified: Secondary | ICD-10-CM | POA: Insufficient documentation

## 2022-08-29 DIAGNOSIS — Z0000 Encounter for general adult medical examination without abnormal findings: Secondary | ICD-10-CM

## 2022-08-29 DIAGNOSIS — E663 Overweight: Secondary | ICD-10-CM | POA: Diagnosis not present

## 2022-08-29 DIAGNOSIS — Z789 Other specified health status: Secondary | ICD-10-CM | POA: Diagnosis not present

## 2022-08-29 DIAGNOSIS — Z720 Tobacco use: Secondary | ICD-10-CM

## 2022-08-29 DIAGNOSIS — F1210 Cannabis abuse, uncomplicated: Secondary | ICD-10-CM | POA: Diagnosis not present

## 2022-08-29 DIAGNOSIS — Z23 Encounter for immunization: Secondary | ICD-10-CM | POA: Diagnosis not present

## 2022-08-29 DIAGNOSIS — Z8249 Family history of ischemic heart disease and other diseases of the circulatory system: Secondary | ICD-10-CM

## 2022-08-29 HISTORY — DX: Family history of other specified conditions: Z84.89

## 2022-08-29 LAB — COMPREHENSIVE METABOLIC PANEL
ALT: 14 U/L (ref 0–53)
AST: 16 U/L (ref 0–37)
Albumin: 4.4 g/dL (ref 3.5–5.2)
Alkaline Phosphatase: 71 U/L (ref 39–117)
BUN: 9 mg/dL (ref 6–23)
CO2: 26 mEq/L (ref 19–32)
Calcium: 9.1 mg/dL (ref 8.4–10.5)
Chloride: 105 mEq/L (ref 96–112)
Creatinine, Ser: 0.94 mg/dL (ref 0.40–1.50)
GFR: 102.01 mL/min (ref >60.00)
Glucose, Bld: 96 mg/dL (ref 70–99)
Potassium: 4.1 mEq/L (ref 3.5–5.1)
Sodium: 139 mEq/L (ref 135–145)
Total Bilirubin: 0.4 mg/dL (ref 0.2–1.2)
Total Protein: 7.4 g/dL (ref 6.0–8.3)

## 2022-08-29 LAB — CBC
HCT: 48.5 % (ref 39.0–52.0)
Hemoglobin: 16.8 g/dL (ref 13.0–17.0)
MCHC: 34.7 g/dL (ref 30.0–36.0)
MCV: 97.7 fl (ref 78.0–100.0)
Platelets: 309 10*3/uL (ref 150.0–400.0)
RBC: 4.97 Mil/uL (ref 4.22–5.81)
RDW: 12.9 % (ref 11.5–15.5)
WBC: 6.5 10*3/uL (ref 4.0–10.5)

## 2022-08-29 LAB — HEMOGLOBIN A1C: Hgb A1c MFr Bld: 5.7 % (ref 4.6–6.5)

## 2022-08-29 LAB — POCT URINALYSIS DIPSTICK
Bilirubin, UA: NEGATIVE
Blood, UA: NEGATIVE
Glucose, UA: NEGATIVE
Ketones, UA: NEGATIVE
Leukocytes, UA: NEGATIVE
Nitrite, UA: NEGATIVE
Protein, UA: NEGATIVE
Spec Grav, UA: 1.02 (ref 1.010–1.025)
Urobilinogen, UA: 0.2 E.U./dL
pH, UA: 6 (ref 5.0–8.0)

## 2022-08-29 LAB — LIPID PANEL
Cholesterol: 208 mg/dL — ABNORMAL HIGH (ref 0–200)
HDL: 61.5 mg/dL (ref >39.00)
LDL Cholesterol: 125 mg/dL — ABNORMAL HIGH (ref 0–99)
NonHDL: 146.33
Total CHOL/HDL Ratio: 3
Triglycerides: 109 mg/dL (ref 0.0–149.0)
VLDL: 21.8 mg/dL (ref 0.0–40.0)

## 2022-08-29 LAB — TSH: TSH: 0.92 u[IU]/mL (ref 0.35–5.50)

## 2022-08-29 NOTE — Patient Instructions (Addendum)
Nice to see you today I will be in touch with the labs I want to see you once a year for a physical  Try cutting back on the alcohol consumption. The recommendation is no more than two drinks a day Also recommend not using THC.  We did update your tetanus vaccine today

## 2022-08-29 NOTE — Assessment & Plan Note (Signed)
Discussed age-appropriate immunizations and screening exams.  Did review patient's personal, surgical, social, family histories.  Update tetanus vaccine today.  Patient is too young for CRC and prostate cancer screening.  Patient was given information about preventative healthcare maintenance with anticipatory guidance for his age range.

## 2022-08-29 NOTE — Assessment & Plan Note (Signed)
Patient had a brother who had a heart attack at age 40 and passed away.  Will add on lipoprotein a to lab work today.  Pending result

## 2022-08-29 NOTE — Assessment & Plan Note (Signed)
Patient's grandmother had a CABG and heart attack patient's father had a heart attack patient's brother had heart attack and died at age 40.  Pending lipids and lipoprotein a

## 2022-08-29 NOTE — Assessment & Plan Note (Addendum)
Multifactorial to alcohol use, smoking.  Patient has used Viagra in the past with good result.  Pending labs will send in Viagra thereafter

## 2022-08-29 NOTE — Assessment & Plan Note (Signed)
Pending labs inclusive of lipid and A1c.

## 2022-08-29 NOTE — Progress Notes (Signed)
New Patient Office Visit  Subjective    Patient ID: Joseph Elliott, male    DOB: 12-07-1980  Age: 42 y.o. MRN: [id number]  CC:  Chief Complaint  Patient presents with   Establish Care   Annual Exam    HPI Joseph Elliott presents to establish care  for complete physical and follow up of chronic conditions.  Immunizations: -Tetanus: Completed in needs today -Influenza:  joint discussion in the flu season defer -Shingles: Too young -Pneumonia: Too young -covid: moderna x2 and booster  Diet: Fair diet. States that he eats 3 times a day. States that he will skip breakfast. Water, some soda. Exercise: No regular exercise. Non outside of employment   Eye exam: Completes annually. Needs updating. Glasses  Dental exam:  need updating  Colonoscopy: Too young, currently average risk Lung Cancer Screening: Does not qualify  PSA: Too young  Sleep: States that he will go to bed around 11-12 and will get up at 5 on the weekend and 730 week days. States that mostly feels rested. Does snore   Ed: states that he can get an erection but not maintaining. States that he does have intermittent and he has tired Viagra in the past that did help      Outpatient Encounter Medications as of 2022/09/03  Medication Sig   ketorolac (TORADOL) 10 MG tablet Take 1 tablet (10 mg total) by mouth every 6 (six) hours as needed. (Patient not taking: Reported on 09/03/22)   No facility-administered encounter medications on file as of September 03, 2022.    Past Medical History:  Diagnosis Date   Family history of early death 09-03-22   Medical history non-contributory     Past Surgical History:  Procedure Laterality Date   NO PAST SURGERIES     SHOULDER ARTHROSCOPY WITH ROTATOR CUFF REPAIR Right 07/08/2018   Procedure: SHOULDER ARTHROSCOPY WITH ANTERIOR LABRAL REPAIR;  Surgeon: Thornton Park, MD;  Location: [facility] ORS;  Service: Orthopedics;  Laterality: Right;    Family History  Problem Relation  Age of Onset   Heart attack Father    Early death Brother        24   Heart attack Brother    Heart attack Maternal Grandmother    CAD Maternal Grandmother     Social History   Socioeconomic History   Marital status: Single    Spouse name: Not on file   Number of children: 1   Years of education: Not on file   Highest education level: Not on file  Occupational History   Not on file  Tobacco Use   Smoking status: Every Day    Packs/day: 1.50    Years: 13.00    Additional pack years: 0.00    Total pack years: 19.50    Types: Cigarettes   Smokeless tobacco: Never  Vaping Use   Vaping Use: Some days   Substances: Nicotine  Substance and Sexual Activity   Alcohol use: Yes    Alcohol/week: 14.0 standard drinks of alcohol    Types: 14 Cans of beer per week    Comment: 5-6 beer qd   Drug use: Yes    Types: Marijuana    Comment: qd. 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  Respiratory:  Negative for shortness of breath.   Cardiovascular:  Negative for chest pain and leg swelling.  Gastrointestinal:  Negative for abdominal pain, blood in stool, constipation, diarrhea, nausea and vomiting.       BM daily   Genitourinary:  Negative for dysuria and hematuria.  Neurological:  Negative for tingling and headaches.  Psychiatric/Behavioral:  Negative for hallucinations and suicidal ideas.         Objective    BP 116/68    Pulse 93   Temp 99.6 F (37.6 C)   Resp 16   Ht 5\' 5"  (1.651 m)   Wt 150 lb 6 oz (68.2 kg)   SpO2 94%   BMI 25.02 kg/m   Physical Exam Vitals and nursing note reviewed. Exam conducted with a chaperone present [employer], cma).  Constitutional:      Appearance: Normal appearance.  HENT:     Right Ear: Tympanic membrane, ear canal and external ear normal.     Left Ear: Tympanic membrane, ear canal and external ear normal.     Mouth/Throat:     Mouth: Mucous membranes are moist.     Pharynx: Oropharynx is clear.  Eyes:     Extraocular Movements: Extraocular movements intact.     Pupils: Pupils are equal, round, and reactive to light.  Cardiovascular:     Rate and Rhythm: Normal rate and regular rhythm.     Pulses: Normal pulses.     Heart sounds: Normal heart sounds.  Pulmonary:     Effort: Pulmonary effort is normal.     Breath sounds: Normal breath sounds.  Abdominal:     General: Bowel sounds are normal. There is no distension.     Palpations: There is no mass.     Tenderness: There is no abdominal tenderness.     Hernia: No hernia is present. There is no hernia in the left inguinal area or right inguinal area.  Genitourinary:    Penis: Normal.      Testes: Normal.     Epididymis:     Right: Normal.     Left: Normal.  Musculoskeletal:     Right lower leg: No edema.     Left lower leg: No edema.  Lymphadenopathy:     Cervical: No cervical adenopathy.     Lower Body: No right inguinal adenopathy. No left inguinal adenopathy.  Skin:    General: Skin is warm.  Neurological:     General: No focal deficit present.     Mental Status: He is alert.     Deep Tendon Reflexes:     Reflex Scores:      Bicep reflexes are 2+ on the right side and 2+ on the left side.      Patellar reflexes are 2+ on the right side and 2+ on the left side.    Comments: Bilateral upper and lower extremity strength 5/5  Psychiatric:        Mood and Affect: Mood normal.        Behavior:  Behavior normal.  Thought Content: Thought content normal.        Judgment: Judgment normal.         Assessment & Plan:   Problem List Items Addressed This Visit       Other   Tetrahydrocannabinol (THC) use disorder, mild, abuse    Had a discussion that is not illegal substance in [place] and is not regulated for purity.  Recommended to reduce and stop smoking THC products      Tobacco abuse    Patient is a current every day smoker has not tried to quit in the past.  Encourage patient to start weaning down when he is ready mentally to try to stop smoking.  Will do a UA today to make sure no blood present      Relevant Orders   POCT urinalysis dipstick   Alcohol use    Encourage patient to reduce the amount of alcohol he consumes today.  Recommendation is to drink today.      Overweight    Pending labs inclusive of lipid and A1c.      Relevant Orders   Hemoglobin A1c   Lipid panel   Preventative health care - Primary    Discussed age-appropriate immunizations and screening exams.  Did review patient's personal, surgical, social, family histories.  Update tetanus vaccine today.  Patient is too young for CRC and prostate cancer screening.  Patient was given information about preventative healthcare maintenance with anticipatory guidance for his age range.      Relevant Orders   CBC   Comprehensive metabolic panel   TSH   Erectile dysfunction    Multifactorial to alcohol use, smoking.  Patient has used Viagra in the past with good result.  Pending labs will send in Viagra thereafter      Family history of early death    Patient had a brother who had a heart attack at age 8 and passed away.  Will add on lipoprotein a to lab work today.  Pending result      Relevant Orders   Lipid panel   Lipoprotein A (LPA)   Family history of early CAD    Patient's grandmother had a CABG and heart attack patient's father had a heart attack patient's brother had heart  attack and died at age 42.  Pending lipids and lipoprotein a      Relevant Orders   Lipid panel   Lipoprotein A (LPA)   Other Visit Diagnoses     Need for Tdap vaccination       Relevant Orders   Tdap vaccine greater than or equal to 7yo IM       Return in about 1 year (around 08/29/2023) for CPE and Labs.   Romilda Garret, NP

## 2022-08-29 NOTE — Assessment & Plan Note (Signed)
Patient is a current every day smoker has not tried to quit in the past.  Encourage patient to start weaning down when he is ready mentally to try to stop smoking.  Will do a UA today to make sure no blood present

## 2022-08-29 NOTE — Assessment & Plan Note (Signed)
Had a discussion that is not illegal substance in [place] and is not regulated for purity.  Recommended to reduce and stop smoking THC products

## 2022-08-29 NOTE — Assessment & Plan Note (Signed)
Encourage patient to reduce the amount of alcohol he consumes today.  Recommendation is to drink today.

## 2022-09-04 ENCOUNTER — Other Ambulatory Visit: Payer: Self-pay | Admitting: Nurse Practitioner

## 2022-09-04 DIAGNOSIS — N529 Male erectile dysfunction, unspecified: Secondary | ICD-10-CM

## 2022-09-04 LAB — LIPOPROTEIN A (LPA): Lipoprotein (a): 184 nmol/L — ABNORMAL HIGH (ref <75)

## 2022-09-04 MED ORDER — SILDENAFIL CITRATE 50 MG PO TABS: 25.0000 mg | ORAL_TABLET | Freq: Every day | ORAL | 0 refills | Status: AC | PRN

## 2022-09-05 ENCOUNTER — Encounter: Payer: Self-pay | Admitting: Nurse Practitioner

## 2022-09-05 DIAGNOSIS — Z8249 Family history of ischemic heart disease and other diseases of the circulatory system: Secondary | ICD-10-CM

## 2022-09-05 DIAGNOSIS — E7800 Pure hypercholesterolemia, unspecified: Secondary | ICD-10-CM | POA: Insufficient documentation

## 2022-09-05 MED ORDER — ROSUVASTATIN CALCIUM 5 MG PO TABS: 5.0000 mg | ORAL_TABLET | Freq: Every day | ORAL | 0 refills | Status: DC

## 2022-09-05 NOTE — Telephone Encounter (Signed)
Patient needs to be scheduled for a 3 month fasting lab appointment. Lab orders have been placed

## 2022-12-05 ENCOUNTER — Other Ambulatory Visit: Payer: Managed Care, Other (non HMO)

## 2022-12-05 DIAGNOSIS — Z8249 Family history of ischemic heart disease and other diseases of the circulatory system: Secondary | ICD-10-CM

## 2022-12-05 DIAGNOSIS — E7800 Pure hypercholesterolemia, unspecified: Secondary | ICD-10-CM

## 2022-12-05 LAB — HEPATIC FUNCTION PANEL
ALT: 20 U/L (ref 0–53)
AST: 20 U/L (ref 0–37)
Albumin: 4.5 g/dL (ref 3.5–5.2)
Alkaline Phosphatase: 64 U/L (ref 39–117)
Bilirubin, Direct: 0.1 mg/dL (ref 0.0–0.3)
Total Bilirubin: 0.5 mg/dL (ref 0.2–1.2)
Total Protein: 7.6 g/dL (ref 6.0–8.3)

## 2022-12-05 LAB — LIPID PANEL
Cholesterol: 178 mg/dL (ref 0–200)
HDL: 57.2 mg/dL (ref >39.00)
LDL Cholesterol: 94 mg/dL (ref 0–99)
NonHDL: 120.4
Total CHOL/HDL Ratio: 3
Triglycerides: 130 mg/dL (ref 0.0–149.0)
VLDL: 26 mg/dL (ref 0.0–40.0)

## 2022-12-08 ENCOUNTER — Other Ambulatory Visit: Payer: Self-pay | Admitting: Nurse Practitioner

## 2022-12-08 DIAGNOSIS — Z8249 Family history of ischemic heart disease and other diseases of the circulatory system: Secondary | ICD-10-CM

## 2022-12-08 DIAGNOSIS — E7800 Pure hypercholesterolemia, unspecified: Secondary | ICD-10-CM

## 2022-12-09 ENCOUNTER — Emergency Department: Payer: Managed Care, Other (non HMO)

## 2022-12-09 ENCOUNTER — Other Ambulatory Visit: Payer: Self-pay

## 2022-12-09 ENCOUNTER — Emergency Department
Admission: EM | Admit: 2022-12-09 | Discharge: 2022-12-09 | Disposition: A | Discharge status: Home / Self Care | Payer: Managed Care, Other (non HMO) | Attending: Emergency Medicine | Admitting: Emergency Medicine

## 2022-12-09 DIAGNOSIS — K5732 Diverticulitis of large intestine without perforation or abscess without bleeding: Secondary | ICD-10-CM | POA: Insufficient documentation

## 2022-12-09 DIAGNOSIS — F17200 Nicotine dependence, unspecified, uncomplicated: Secondary | ICD-10-CM | POA: Diagnosis not present

## 2022-12-09 DIAGNOSIS — R109 Unspecified abdominal pain: Secondary | ICD-10-CM | POA: Diagnosis present

## 2022-12-09 DIAGNOSIS — R1032 Left lower quadrant pain: Secondary | ICD-10-CM

## 2022-12-09 LAB — COMPREHENSIVE METABOLIC PANEL
ALT: 18 U/L (ref 0–44)
AST: 15 U/L (ref 15–41)
Albumin: 4.6 g/dL (ref 3.5–5.0)
Alkaline Phosphatase: 70 U/L (ref 38–126)
Anion gap: 10 (ref 5–15)
BUN: 10 mg/dL (ref 6–20)
CO2: 21 mmol/L — ABNORMAL LOW (ref 22–32)
Calcium: 8.8 mg/dL — ABNORMAL LOW (ref 8.9–10.3)
Chloride: 103 mmol/L (ref 98–111)
Creatinine, Ser: 0.84 mg/dL (ref 0.61–1.24)
GFR, Estimated: >60 mL/min (ref >60)
Glucose, Bld: 103 mg/dL — ABNORMAL HIGH (ref 70–99)
Potassium: 3.5 mmol/L (ref 3.5–5.1)
Sodium: 134 mmol/L — ABNORMAL LOW (ref 135–145)
Total Bilirubin: 1.5 mg/dL — ABNORMAL HIGH (ref 0.3–1.2)
Total Protein: 8.3 g/dL — ABNORMAL HIGH (ref 6.5–8.1)

## 2022-12-09 LAB — CBC
HCT: 46.2 % (ref 39.0–52.0)
Hemoglobin: 16.3 g/dL (ref 13.0–17.0)
MCH: 32.9 pg (ref 26.0–34.0)
MCHC: 35.3 g/dL (ref 30.0–36.0)
MCV: 93.3 fL (ref 80.0–100.0)
Platelets: 268 10*3/uL (ref 150–400)
RBC: 4.95 MIL/uL (ref 4.22–5.81)
RDW: 12.5 % (ref 11.5–15.5)
WBC: 13.4 10*3/uL — ABNORMAL HIGH (ref 4.0–10.5)
nRBC: 0 % (ref 0.0–0.2)

## 2022-12-09 LAB — URINALYSIS, ROUTINE W REFLEX MICROSCOPIC
Bilirubin Urine: NEGATIVE
Glucose, UA: NEGATIVE mg/dL
Hgb urine dipstick: NEGATIVE
Ketones, ur: 5 mg/dL — AB
Leukocytes,Ua: NEGATIVE
Nitrite: NEGATIVE
Protein, ur: NEGATIVE mg/dL
Specific Gravity, Urine: 1.023 (ref 1.005–1.030)
pH: 5 (ref 5.0–8.0)

## 2022-12-09 LAB — LIPASE, BLOOD: Lipase: 28 U/L (ref 11–51)

## 2022-12-09 MED ORDER — IOHEXOL 300 MG/ML  SOLN
100.0000 mL | Freq: Once | INTRAMUSCULAR | Status: AC | PRN
  Administered 2022-12-09: 100 mL via INTRAVENOUS

## 2022-12-09 MED ORDER — METRONIDAZOLE 500 MG/100ML IV SOLN: 500.0000 mg | Freq: Once | INTRAVENOUS | Status: DC

## 2022-12-09 MED ORDER — KETOROLAC TROMETHAMINE 30 MG/ML IJ SOLN
30.0000 mg | Freq: Once | INTRAMUSCULAR | Status: AC
Start: 2022-12-09 — End: 2022-12-09
  Administered 2022-12-09: 30 mg via INTRAVENOUS
  Filled 2022-12-09: qty 1

## 2022-12-09 MED ORDER — SULFAMETHOXAZOLE-TRIMETHOPRIM 800-160 MG PO TABS: 1.0000 | ORAL_TABLET | Freq: Two times a day (BID) | ORAL | 0 refills | Status: DC

## 2022-12-09 MED ORDER — SULFAMETHOXAZOLE-TRIMETHOPRIM 800-160 MG PO TABS
1.0000 | ORAL_TABLET | Freq: Once | ORAL | Status: AC
  Administered 2022-12-09: 1 via ORAL
  Filled 2022-12-09: qty 1

## 2022-12-09 MED ORDER — ONDANSETRON 4 MG PO TBDP: 4.0000 mg | ORAL_TABLET | Freq: Three times a day (TID) | ORAL | 0 refills | Status: AC | PRN

## 2022-12-09 MED ORDER — ONDANSETRON HCL 4 MG/2ML IJ SOLN
4.0000 mg | Freq: Once | INTRAMUSCULAR | Status: AC
Start: 2022-12-09 — End: 2022-12-09
  Administered 2022-12-09: 4 mg via INTRAVENOUS
  Filled 2022-12-09: qty 2

## 2022-12-09 MED ORDER — SODIUM CHLORIDE 0.9 % IV BOLUS
1000.0000 mL | Freq: Once | INTRAVENOUS | Status: AC
  Administered 2022-12-09: 1000 mL via INTRAVENOUS

## 2022-12-09 MED ORDER — HYDROCODONE-ACETAMINOPHEN 5-325 MG PO TABS: 1.0000 | ORAL_TABLET | Freq: Three times a day (TID) | ORAL | 0 refills | Status: AC | PRN

## 2022-12-09 MED ORDER — METRONIDAZOLE 500 MG PO TABS
500.0000 mg | ORAL_TABLET | Freq: Once | ORAL | Status: AC
Start: 2022-12-09 — End: 2022-12-09
  Administered 2022-12-09: 500 mg via ORAL
  Filled 2022-12-09: qty 1

## 2022-12-09 MED ORDER — METRONIDAZOLE 500 MG PO TABS: 500.0000 mg | ORAL_TABLET | Freq: Three times a day (TID) | ORAL | 0 refills | Status: AC

## 2022-12-09 NOTE — ED Triage Notes (Signed)
Pt to ED for sharp abdominal pain that started on R lower side 2 days ago and has moved to L side. Pain is sharp and worse with movement. Denies urinary symptoms and NVD. LBM this morning but small amount and hard.

## 2022-12-09 NOTE — Discharge Instructions (Addendum)
Your exam and labs as well as CT scan confirming acute diverticulitis.  No evidence of bowel obstruction, appendicitis, or abscess formation.  You will be treated with 2 different antibiotics as discussed.  Also prescriptions for pain medicine and nausea medicine to take as needed.  You should rest your bowels with a liquid diet as discussed, and advance as tolerated.  Follow-up with your primary provider or GI medicine for ongoing evaluation management.  Return to the ED if needed.

## 2022-12-09 NOTE — ED Provider Notes (Signed)
[facility] Emergency Department Provider Note     Event Date/Time   First MD Initiated Contact with Patient 12/09/22 1525     (approximate)   History   Abdominal Pain   HPI  Joseph Elliott is a 42 y.o. male with a history of tobacco use, THC use disorder, and HLD, presents to the ED for evaluation of abdominal pain.  Patient reports lower abdominal discomfort to the right initially and now the lower left side.  He reports onset of symptoms 2 days prior.  He describes the pain as sharp in nature and aggravated by movement.  He denies any nausea, vomiting, or diarrhea.  Also denies any urinary symptoms, fever, chills, chest pain, shortness of breath.  Patient reports his last bowel movement was this morning, but describes it as small in volume and firm hard stool balls.  He denies any rectal pain, rectal bleeding, dark melanotic stools, or BRBPR. He also denies any history of colitis, diverticulitis, or IBS.  Physical Exam   Triage Vital Signs: ED Triage Vitals  Enc Vitals Group     BP 12/09/22 1408 131/74     Pulse Rate 12/09/22 1408 88     Resp 12/09/22 1408 16     Temp 12/09/22 1408 98.3 F (36.8 C)     Temp Source 12/09/22 1408 Oral     SpO2 12/09/22 1408 96 %     Weight 12/09/22 1409 150 lb (68 kg)     Height 12/09/22 1409 5\' 5"  (1.651 m)     Head Circumference --      Peak Flow --      Pain Score 12/09/22 1408 8     Pain Loc --      Pain Edu? --      Excl. in GC? --     Most recent vital signs: Vitals:   12/09/22 1408  BP: 131/74  Pulse: 88  Resp: 16  Temp: 98.3 F (36.8 C)  SpO2: 96%    General Awake, no distress. NAD CV:  Good peripheral perfusion.  RESP:  Normal effort.  ABD:  No distention.  Protuberant, soft and only mildly tender to palpation to the left lower quadrant.  Normal active bowel sounds noted x 4.  No rebound, guarding, or rigidity noted.  No CVA tenderness elicited.   ED Results / Procedures / Treatments    Labs (all labs ordered are listed, but only abnormal results are displayed) Labs Reviewed  COMPREHENSIVE METABOLIC PANEL - Abnormal; Notable for the following components:      Result Value   Sodium 134 (*)    CO2 21 (*)    Glucose, Bld 103 (*)    Calcium 8.8 (*)    Total Protein 8.3 (*)    Total Bilirubin 1.5 (*)    All other components within normal limits  CBC - Abnormal; Notable for the following components:   WBC 13.4 (*)    All other components within normal limits  URINALYSIS, ROUTINE W REFLEX MICROSCOPIC - Abnormal; Notable for the following components:   Color, Urine YELLOW (*)    APPearance CLEAR (*)    Ketones, ur 5 (*)    All other components within normal limits  LIPASE, BLOOD    EKG   RADIOLOGY  I personally viewed and evaluated these images as part of my medical decision making, as well as reviewing the written report by the radiologist.  ED Provider Interpretation: Left lower quadrant diverticulitis  CT ABDOMEN PELVIS W CONTRAST  Result Date: 12/09/2022 CLINICAL DATA:  LEFT lower quadrant pain and RIGHT lower quadrant pain. EXAM: CT ABDOMEN AND PELVIS WITH CONTRAST TECHNIQUE: Multidetector CT imaging of the abdomen and pelvis was performed using the standard protocol following bolus administration of intravenous contrast. RADIATION DOSE REDUCTION: This exam was performed according to the departmental dose-optimization program which includes automated exposure control, adjustment of the mA and/or kV according to patient size and/or use of iterative reconstruction technique. CONTRAST:  OMNIPAQUE IOHEXOL 300 MG/ML  SOLN COMPARISON:  None Available. FINDINGS: Lower chest: Lung bases are clear. Hepatobiliary: No focal hepatic lesion. Normal gallbladder. No biliary duct dilatation. Common bile duct is normal. Pancreas: Pancreas is normal. No ductal dilatation. No pancreatic inflammation. Spleen: Normal spleen Adrenals/urinary tract: Adrenal glands and kidneys are  normal. The ureters and bladder normal. Stomach/Bowel: Stomach, small bowel, appendix, and cecum are normal. Along the descending colon there is inflammation in the peritoneal fat adjacent to the colon (image 50/3. There are several diverticula of the descending colon. One large inflamed diverticulum measures 12 mm (image 28/6). Sigmoid colon and rectosigmoid colon are not inflamed. Several large diverticula of the sigmoid colon. Vascular/Lymphatic: Abdominal aorta is normal caliber. No periportal or retroperitoneal adenopathy. No pelvic adenopathy. Reproductive: Prostate unremarkable Other: No free fluid. Musculoskeletal: No aggressive osseous lesion. IMPRESSION: 1. Acute diverticulitis of the descending colon. Large inflamed diverticulum probable source of diverticulitis. 2. Normal appendix. 3. Normal gallbladder and biliary tree. Electronically Signed   By: Genevive Bi M.D.   On: 12/09/2022 17:01   DG Abdomen 1 View  Result Date: 12/09/2022 CLINICAL DATA:  Left lower quadrant pain. EXAM: ABDOMEN - 1 VIEW COMPARISON:  None Available. FINDINGS: Normal bowel gas pattern. Normal abdominopelvic soft tissues. Clear lung bases. Skeletal structures are unremarkable. IMPRESSION: Negative. Electronically Signed   By: Amie Portland M.D.   On: 12/09/2022 16:01     PROCEDURES:  Critical Care performed: No  Procedures   MEDICATIONS ORDERED IN ED: Medications  iohexol (OMNIPAQUE) 300 MG/ML solution 100 mL (100 mLs Intravenous Contrast Given 12/09/22 1640)  ondansetron (ZOFRAN) injection 4 mg (4 mg Intravenous Given 12/09/22 1702)  sodium chloride 0.9 % bolus 1,000 mL (0 mLs Intravenous Stopped 12/09/22 1805)  sulfamethoxazole-trimethoprim (BACTRIM DS) 800-160 MG per tablet 1 tablet (1 tablet Oral Given 12/09/22 1804)  metroNIDAZOLE (FLAGYL) tablet 500 mg (500 mg Oral Given 12/09/22 1804)  ketorolac (TORADOL) 30 MG/ML injection 30 mg (30 mg Intravenous Given 12/09/22 1840)     IMPRESSION / MDM / ASSESSMENT AND  PLAN / ED COURSE  I reviewed the triage vital signs and the nursing notes.                              Differential diagnosis includes, but is not limited to, acute appendicitis, renal colic, testicular torsion, urinary tract infection/pyelonephritis, prostatitis,  epididymitis, diverticulitis, small bowel obstruction or ileus, colitis, abdominal aortic aneurysm, gastroenteritis, hernia, etc.   Patient's presentation is most consistent with acute complicated illness / injury requiring diagnostic workup.  Patient's diagnosis is consistent with acute diverticulitis without evidence of abscess or perforation.  Patient apparently had a episode of emesis after the IV contrast was pushed over in the CT department.  He returns to the treatment room in no acute distress endorsing some continued lower quadrant discomfort.  Patient is given IV fluid bolus as well as IV Zofran, and Toradol for pain.  Patient  is stable at this time with no indication of sepsis with exception of a mildly elevated white blood cell count.  Patient has been afebrile with otherwise normal vital signs.  He is amenable to the plan of outpatient management with antibiotics, nausea medicine, and pain medicine.  Patient will also be advised to start a liquid diet and advance as tolerated.  Patient will be discharged home with prescriptions for Zofran, Vicodin, Bactrim, metronidazole. Patient is to follow up with his primary provider and GI medicine as discussed, as needed or otherwise directed. Patient is given ED precautions to return to the ED for any worsening or new symptoms.    FINAL CLINICAL IMPRESSION(S) / ED DIAGNOSES   Final diagnoses:  Left lower quadrant abdominal pain  Diverticulitis large intestine w/o perforation or abscess w/o bleeding     Rx / DC Orders   ED Discharge Orders          Ordered    sulfamethoxazole-trimethoprim (BACTRIM DS) 800-160 MG tablet  2 times daily        12/09/22 1759    metroNIDAZOLE  (FLAGYL) 500 MG tablet  3 times daily        12/09/22 1759    ondansetron (ZOFRAN-ODT) 4 MG disintegrating tablet  Every 8 hours PRN        12/09/22 1759    HYDROcodone-acetaminophen (NORCO) 5-325 MG tablet  3 times daily PRN        12/09/22 1759             Note:  This document was prepared using Dragon voice recognition software and may include unintentional dictation errors.    Lissa Hoard, PA-C 12/09/22 2049    Dionne Bucy, MD 12/10/22 2252

## 2022-12-10 ENCOUNTER — Telehealth: Payer: Self-pay

## 2022-12-10 NOTE — Telephone Encounter (Signed)
Medication: Rosuvastatin 5 mg  Directions: Take 1 tablet po daily  Last given: 09/05/22 Number refills: 0 Last o/v: 08/29/22 Follow up: 1 year Labs: 12/05/22

## 2022-12-10 NOTE — Transitions of Care (Post Inpatient/ED Visit) (Signed)
   12/10/2022  Name: Joseph Elliott MRN: [id number] DOB: 02-Nov-1980  Today's TOC FU Call Status: Today's TOC FU Call Status:: Unsuccessul Call (1st Attempt) Unsuccessful Call (1st Attempt) Date: 12/10/22  Attempted to reach the patient regarding the most recent Inpatient/ED visit.  Follow Up Plan: Additional outreach attempts will be made to reach the patient to complete the Transitions of Care (Post Inpatient/ED visit) call.   Signature   Woodfin Ganja LPN [facility] Nurse Health Advisor Direct Dial [phone]

## 2022-12-10 NOTE — Transitions of Care (Post Inpatient/ED Visit) (Signed)
   12/10/2022  Name: Joseph Elliott MRN: [id number] DOB: February 08, 1981  Today's TOC FU Call Status: Today's TOC FU Call Status:: Successful TOC FU Call Competed Unsuccessful Call (1st Attempt) Date: 12/10/22 [facility] FU Call Complete Date: 12/10/22  Transition Care Management Follow-up Telephone Call Date of Discharge: 12/09/22 Discharge Facility: [facility]) Type of Discharge: Emergency Department Reason for ED Visit: Other: (colitis) How have you been since you were released from the hospital?: Better Any questions or concerns?: No  Items Reviewed: Did you receive and understand the discharge instructions provided?: Yes Medications obtained,verified, and reconciled?: Yes (Medications Reviewed) Any new allergies since your discharge?: No Dietary orders reviewed?: Yes Do you have support at home?: Yes  Medications Reviewed Today: Medications Reviewed Today     Reviewed by Merleen Nicely, LPN (Licensed Practical Nurse) on 12/10/22 at 1416  Med List Status: <None>   Medication Order Taking? Sig Documenting Provider Last Dose Status Informant  HYDROcodone-acetaminophen (NORCO) 5-325 MG tablet [id number] Yes Take 1 tablet by mouth 3 (three) times daily as needed for up to 4 days. Menshew, Charlesetta Ivory, PA-C Taking Active   ketorolac (TORADOL) 10 MG tablet [id number] No Take 1 tablet (10 mg total) by mouth every 6 (six) hours as needed.  Patient not taking: Reported on 08/29/2022   Arnaldo Natal, MD Not Taking Active   metroNIDAZOLE (FLAGYL) 500 MG tablet [id number] Yes Take 1 tablet (500 mg total) by mouth 3 (three) times daily for 7 days. Menshew, Charlesetta Ivory, PA-C Taking Active   ondansetron (ZOFRAN-ODT) 4 MG disintegrating tablet [id number] Yes Take 1 tablet (4 mg total) by mouth every 8 (eight) hours as needed for nausea or vomiting. Menshew, Charlesetta Ivory, PA-C Taking Active   rosuvastatin (CRESTOR) 5 MG tablet [id number] Yes Take 1 tablet (5 mg total) by  mouth daily. Eden Emms, NP Taking Active   sildenafil (VIAGRA) 50 MG tablet [id number] Yes Take 0.5-1 tablets (25-50 mg total) by mouth daily as needed for erectile dysfunction. Eden Emms, NP Taking Active   sulfamethoxazole-trimethoprim (BACTRIM DS) 800-160 MG tablet [id number] Yes Take 1 tablet by mouth 2 (two) times daily. Menshew, Charlesetta Ivory, PA-C Taking Active             Home Care and Equipment/Supplies: Were Home Health Services Ordered?: NA Any new equipment or medical supplies ordered?: NA  Functional Questionnaire: Do you need assistance with bathing/showering or dressing?: No Do you need assistance with meal preparation?: No Do you need assistance with eating?: No Do you have difficulty maintaining continence: No Do you need assistance with getting out of bed/getting out of a chair/moving?: No Do you have difficulty managing or taking your medications?: No  Follow up appointments reviewed: PCP Follow-up appointment confirmed?: Yes Date of PCP follow-up appointment?: 12/17/22 Follow-up Provider: Mordecai Maes NP [facility] Follow-up appointment confirmed?: No Do you need transportation to your follow-up appointment?: No Do you understand care options if your condition(s) worsen?: Yes-patient verbalized understanding    SIGNATURE  Woodfin Ganja LPN [facility] Nurse Health Advisor Direct Dial [phone]

## 2022-12-10 NOTE — Telephone Encounter (Signed)
Patient returned call From Joseph Elliott-gave patient Joseph Elliott's number to return her call

## 2022-12-10 NOTE — Telephone Encounter (Signed)
noted 

## 2022-12-17 ENCOUNTER — Ambulatory Visit (INDEPENDENT_AMBULATORY_CARE_PROVIDER_SITE_OTHER): Payer: Managed Care, Other (non HMO) | Admitting: Nurse Practitioner

## 2022-12-17 ENCOUNTER — Encounter: Payer: Self-pay | Admitting: Nurse Practitioner

## 2022-12-17 VITALS — BP 110/62 | HR 89 | Temp 98.0°F | Ht 65.0 in | Wt 136.0 lb

## 2022-12-17 DIAGNOSIS — Z114 Encounter for screening for human immunodeficiency virus [HIV]: Secondary | ICD-10-CM

## 2022-12-17 DIAGNOSIS — Z1159 Encounter for screening for other viral diseases: Secondary | ICD-10-CM

## 2022-12-17 DIAGNOSIS — Z09 Encounter for follow-up examination after completed treatment for conditions other than malignant neoplasm: Secondary | ICD-10-CM | POA: Diagnosis not present

## 2022-12-17 DIAGNOSIS — R112 Nausea with vomiting, unspecified: Secondary | ICD-10-CM | POA: Diagnosis not present

## 2022-12-17 DIAGNOSIS — G479 Sleep disorder, unspecified: Secondary | ICD-10-CM | POA: Insufficient documentation

## 2022-12-17 LAB — CBC
HCT: 49.6 % (ref 39.0–52.0)
Hemoglobin: 16.9 g/dL (ref 13.0–17.0)
MCHC: 34 g/dL (ref 30.0–36.0)
MCV: 97.1 fl (ref 78.0–100.0)
Platelets: 357 10*3/uL (ref 150.0–400.0)
RBC: 5.11 Mil/uL (ref 4.22–5.81)
RDW: 12.7 % (ref 11.5–15.5)
WBC: 7.2 10*3/uL (ref 4.0–10.5)

## 2022-12-17 LAB — COMPREHENSIVE METABOLIC PANEL
ALT: 37 U/L (ref 0–53)
AST: 31 U/L (ref 0–37)
Albumin: 4.6 g/dL (ref 3.5–5.2)
Alkaline Phosphatase: 63 U/L (ref 39–117)
BUN: 8 mg/dL (ref 6–23)
CO2: 27 mEq/L (ref 19–32)
Calcium: 9.8 mg/dL (ref 8.4–10.5)
Chloride: 98 mEq/L (ref 96–112)
Creatinine, Ser: 0.99 mg/dL (ref 0.40–1.50)
GFR: 95.66 mL/min (ref >60.00)
Glucose, Bld: 126 mg/dL — ABNORMAL HIGH (ref 70–99)
Potassium: 4.3 mEq/L (ref 3.5–5.1)
Sodium: 133 mEq/L — ABNORMAL LOW (ref 135–145)
Total Bilirubin: 0.3 mg/dL (ref 0.2–1.2)
Total Protein: 7.7 g/dL (ref 6.0–8.3)

## 2022-12-17 LAB — CK: Total CK: 47 U/L (ref 7–232)

## 2022-12-17 MED ORDER — HYDROXYZINE PAMOATE 25 MG PO CAPS: 25.0000 mg | ORAL_CAPSULE | Freq: Every evening | ORAL | 0 refills | Status: DC | PRN

## 2022-12-17 MED ORDER — OMEPRAZOLE 20 MG PO CPDR: 20.0000 mg | DELAYED_RELEASE_CAPSULE | Freq: Every day | ORAL | 0 refills | Status: DC

## 2022-12-17 NOTE — Patient Instructions (Signed)
Nice to see you today Advance your diet as you tolerate. Can start with clear liquids like broth and Jello. Then try a BRAT diet (bananas, rice, applesauce, toast) If you do not improve over the course of this week reach out to the office and let me know

## 2022-12-17 NOTE — Progress Notes (Signed)
Established Patient Office Visit  Subjective   Patient ID: Joseph Elliott, male    DOB: [birth year]  Age: 42 y.o. MRN: [id number]  Chief Complaint  Patient presents with   Hospitalization Follow-up    Pt. States that he is not feeling better after hospital visit. Pt complains of not being able to keep food down.     HPI  Hospital follow up: patine twas seen on 12/09/2022 and dx with diverticulitis and started on bactrium and flagyl. He is here for a follow up   States that he is not having stomach pain. State that he is having no appetitie and nausea. States that he is  trying to eat and will throw up. States that certain foods he can tolerate but others he can tolerate it.   States that he had a BM this morning. States that it was more solid and no blood. States that the LLQ is not painful. States fatigued and brown urine. Urine a little better this morning.  Patient states he is not having any trouble maintaining liquid.  States that he has stopped drinking alcohol completely.  He plans on continuing abstaining.  States that he is notices having more trouble sleeping since stopping drinking alcohol.   Review of Systems  Constitutional:  Negative for chills and fever.  Respiratory:  Negative for shortness of breath.   Cardiovascular:  Negative for chest pain.  Gastrointestinal:  Positive for heartburn, nausea and vomiting. Negative for abdominal pain, blood in stool, constipation and diarrhea.  Neurological:  Negative for headaches.  Psychiatric/Behavioral:  Negative for hallucinations and suicidal ideas.       Objective:     BP 110/62   Pulse 89   Temp 98 F (36.7 C) (Temporal)   Ht 5\' 5"  (1.651 m)   Wt 136 lb (61.7 kg)   SpO2 95%   BMI 22.63 kg/m  BP Readings from Last 3 Encounters:  12/17/22 110/62  12/09/22 131/74  08/29/22 116/68   Wt Readings from Last 3 Encounters:  12/17/22 136 lb (61.7 kg)  12/09/22 150 lb (68 kg)  08/29/22 150 lb 6 oz (68.2 kg)       Physical Exam Vitals and nursing note reviewed.  Constitutional:      Appearance: Normal appearance.  Cardiovascular:     Rate and Rhythm: Normal rate and regular rhythm.     Heart sounds: Normal heart sounds.  Pulmonary:     Effort: Pulmonary effort is normal.     Breath sounds: Normal breath sounds.  Abdominal:     General: Bowel sounds are normal. There is no distension.     Palpations: There is no mass.     Tenderness: There is no abdominal tenderness.     Hernia: No hernia is present.  Neurological:     Mental Status: He is alert.      No results found for any visits on 12/17/22.    The ASCVD Risk score (Arnett DK, et al., 2019) failed to calculate for the following reasons:   The 2019 ASCVD risk score is only valid for ages 56 to 31    Assessment & Plan:   Problem List Items Addressed This Visit       Digestive   Nausea and vomiting    Patient still currently has ondansetron antiemetic at home to take as needed.  Will advance his diet slowly you start with clear liquids like chicken broth and Jell-O and then move up to more of a  bland/brat diet.  Will also put patient on omeprazole in case he is having a secondary silent reflux causing the vomiting.  Patient will reach out within a week if he is not improving  .  Patient describes dark-colored urine in setting of decreased appetite with nausea and vomiting will check renal function along with a CK to make sure patient has not gotten to a rhabdomyolysis      Relevant Medications   omeprazole (PRILOSEC) 20 MG capsule     Other   Sleep disturbance    Patient states he had more difficulty going to sleep since he is abstain from alcohol use.  Patient also has self cleaned anxiety will try hydroxyzine 25 mg nightly as needed.  Sedation cautions reviewed.  If this is beneficial and does help with anxiety we can consider doing it 2-3 times a day as needed if he tolerates medication well.      Relevant Medications    hydrOXYzine (VISTARIL) 25 MG capsule   Hospital discharge follow-up - Primary    Reviewed ED note along with x-ray labs and CT scan of abdomen pelvis.      Relevant Orders   CBC   Comprehensive metabolic panel   CK   Other Visit Diagnoses     Encounter for screening for HIV       Relevant Orders   HIV antibody (with reflex)   Encounter for hepatitis C screening test for low risk patient       Relevant Orders   Hepatitis C Antibody       Return if symptoms worsen or fail to improve.    Audria Nine, NP

## 2022-12-17 NOTE — Assessment & Plan Note (Signed)
Reviewed ED note along with x-ray labs and CT scan of abdomen pelvis.

## 2022-12-17 NOTE — Assessment & Plan Note (Addendum)
Patient still currently has ondansetron antiemetic at home to take as needed.  Will advance his diet slowly you start with clear liquids like chicken broth and Jell-O and then move up to more of a bland/brat diet.  Will also put patient on omeprazole in case he is having a secondary silent reflux causing the vomiting.  Patient will reach out within a week if he is not improving  .  Patient describes dark-colored urine in setting of decreased appetite with nausea and vomiting will check renal function along with a CK to make sure patient has not gotten to a rhabdomyolysis

## 2022-12-17 NOTE — Assessment & Plan Note (Signed)
Patient states he had more difficulty going to sleep since he is abstain from alcohol use.  Patient also has self cleaned anxiety will try hydroxyzine 25 mg nightly as needed.  Sedation cautions reviewed.  If this is beneficial and does help with anxiety we can consider doing it 2-3 times a day as needed if he tolerates medication well.

## 2022-12-18 LAB — HEPATITIS C ANTIBODY: Hepatitis C Ab: NONREACTIVE

## 2022-12-18 LAB — HIV ANTIBODY (ROUTINE TESTING W REFLEX): HIV 1&2 Ab, 4th Generation: NONREACTIVE

## 2023-01-08 ENCOUNTER — Other Ambulatory Visit: Payer: Self-pay | Admitting: Nurse Practitioner

## 2023-01-08 DIAGNOSIS — G479 Sleep disorder, unspecified: Secondary | ICD-10-CM

## 2023-01-08 DIAGNOSIS — R112 Nausea with vomiting, unspecified: Secondary | ICD-10-CM

## 2023-01-09 NOTE — Telephone Encounter (Signed)
Called pt. Pt stated that they have been doing well on Hydroxyzine and that it is helping with sleep.

## 2023-01-09 NOTE — Telephone Encounter (Signed)
Can we see if they hydroxyzine has been helping with sleep as I got a refill request from the pharmacy

## 2023-02-26 ENCOUNTER — Ambulatory Visit (INDEPENDENT_AMBULATORY_CARE_PROVIDER_SITE_OTHER): Payer: Managed Care, Other (non HMO) | Admitting: Primary Care

## 2023-02-26 ENCOUNTER — Encounter: Payer: Self-pay | Admitting: Primary Care

## 2023-02-26 VITALS — BP 124/82 | HR 70 | Temp 99.5°F | Ht 65.0 in | Wt 135.0 lb

## 2023-02-26 DIAGNOSIS — R1032 Left lower quadrant pain: Secondary | ICD-10-CM | POA: Insufficient documentation

## 2023-02-26 LAB — CBC WITH DIFFERENTIAL/PLATELET
Basophils Absolute: 0 10*3/uL (ref 0.0–0.1)
Basophils Relative: 0.6 % (ref 0.0–3.0)
Eosinophils Absolute: 0.1 10*3/uL (ref 0.0–0.7)
Eosinophils Relative: 1.7 % (ref 0.0–5.0)
HCT: 47.1 % (ref 39.0–52.0)
Hemoglobin: 16 g/dL (ref 13.0–17.0)
Lymphocytes Relative: 16.4 % (ref 12.0–46.0)
Lymphs Abs: 1.4 10*3/uL (ref 0.7–4.0)
MCHC: 33.9 g/dL (ref 30.0–36.0)
MCV: 98.7 fl (ref 78.0–100.0)
Monocytes Absolute: 0.7 10*3/uL (ref 0.1–1.0)
Monocytes Relative: 7.8 % (ref 3.0–12.0)
Neutro Abs: 6.3 10*3/uL (ref 1.4–7.7)
Neutrophils Relative %: 73.5 % (ref 43.0–77.0)
Platelets: 277 10*3/uL (ref 150.0–400.0)
RBC: 4.78 Mil/uL (ref 4.22–5.81)
RDW: 13.9 % (ref 11.5–15.5)
WBC: 8.5 10*3/uL (ref 4.0–10.5)

## 2023-02-26 MED ORDER — CIPROFLOXACIN HCL 500 MG PO TABS
500.0000 mg | ORAL_TABLET | Freq: Two times a day (BID) | ORAL | 0 refills | Status: AC
Start: 2023-02-26 — End: 2023-03-08

## 2023-02-26 MED ORDER — METRONIDAZOLE 500 MG PO TABS
500.0000 mg | ORAL_TABLET | Freq: Three times a day (TID) | ORAL | 0 refills | Status: AC
Start: 2023-02-26 — End: 2023-03-08

## 2023-02-26 NOTE — Progress Notes (Signed)
Subjective:    Patient ID: Joseph Elliott, male    DOB: May 14, 1981, 42 y.o.   MRN: [id number]  HPI  Joseph Elliott is a very pleasant 42 y.o. male patient of Matt, NP with a history of acute diverticulitis to the descending colon, hyperlipidemia, cannabis use, tobacco use who presents today to discuss abdominal pain.  Evaluated in the ED in July 2024 for acute LLQ abdominal pain. CT revealed acute diverticulitis to the descending colon without complication. Treated with Bactrim DS and metronidazole.   Symptoms began two days ago with abdominal pain to the LLQ abdominal pain with radiation to left groin. His pain is intermittent. Also with 1-2 week history of alternating constipation and diarrhea. Also with intermittent blood in the stools which began in July 2024, last episode was 2 weeks ago with a bowel movement. Bright red.   He is smoking marijuana twice daily which has helped. Over the last month he's reverted back to 6 beers nightly, smoking 2 PPD of cigarettes, eating fried food multiple times daily most of the week.   He denies nausea and vomiting.   Review of Systems  Constitutional:  Negative for fever.  Respiratory:  Negative for shortness of breath.   Cardiovascular:  Negative for chest pain.  Gastrointestinal:  Positive for abdominal pain, blood in stool, constipation and diarrhea. Negative for nausea and vomiting.         Past Medical History:  Diagnosis Date   Family history of early death Sep 22, 2022   Hyperlipidemia 2024   Medical history non-contributory     Social History   Socioeconomic History   Marital status: Single    Spouse name: Not on file   Number of children: 1   Years of education: Not on file   Highest education level: Not on file  Occupational History   Not on file  Tobacco Use   Smoking status: Every Day    Current packs/day: 1.50    Average packs/day: 1.5 packs/day for 13.0 years (19.5 ttl pk-yrs)    Types: Cigarettes   Smokeless  tobacco: Never  Vaping Use   Vaping status: Some Days   Substances: Nicotine  Substance and Sexual Activity   Alcohol use: Yes    Alcohol/week: 14.0 standard drinks of alcohol    Types: 14 Cans of beer per week    Comment: 5-6 beer qd   Drug use: Yes    Types: Marijuana    Comment: qd. 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)      Hobbies: playing basketball and video games   Social Determinants of Health   Financial Resource Strain: Not on file  Food Insecurity: Not on file  Transportation Needs: Not on file  Physical Activity: Not on file  Stress: Not on file  Social Connections: Not on file  Intimate Partner Violence: Not on file    Past Surgical History:  Procedure Laterality Date   NO PAST SURGERIES     SHOULDER ARTHROSCOPY WITH ROTATOR CUFF REPAIR Right 07/08/2018   Procedure: SHOULDER ARTHROSCOPY WITH ANTERIOR LABRAL REPAIR;  Surgeon: Juanell Fairly, MD;  Location: [facility] ORS;  Service: Orthopedics;  Laterality: Right;    Family History  Problem Relation Age of Onset   Heart attack Father  Early death Brother        16   Heart attack Brother    Heart attack Maternal Grandmother    CAD Maternal Grandmother     No Known Allergies  Current Outpatient Medications on File Prior to Visit  Medication Sig Dispense Refill   hydrOXYzine (VISTARIL) 25 MG capsule TAKE 1 CAPSULE (25 MG TOTAL) BY MOUTH AT BEDTIME AS NEEDED. 90 capsule 1   rosuvastatin (CRESTOR) 5 MG tablet TAKE 1 TABLET (5 MG TOTAL) BY MOUTH DAILY. 30 tablet 2   ketorolac (TORADOL) 10 MG tablet Take 1 tablet (10 mg total) by mouth every 6 (six) hours as needed. (Patient not taking: Reported on 08/29/2022) 20 tablet 0   omeprazole (PRILOSEC) 20 MG capsule TAKE 1 CAPSULE BY MOUTH EVERY DAY (Patient not taking: Reported on 02/26/2023) 90  capsule 1   ondansetron (ZOFRAN-ODT) 4 MG disintegrating tablet Take 1 tablet (4 mg total) by mouth every 8 (eight) hours as needed for nausea or vomiting. (Patient not taking: Reported on 02/26/2023) 15 tablet 0   sildenafil (VIAGRA) 50 MG tablet Take 0.5-1 tablets (25-50 mg total) by mouth daily as needed for erectile dysfunction. (Patient not taking: Reported on 02/26/2023) 10 tablet 0   No current facility-administered medications on file prior to visit.    BP 124/82   Pulse 70   Temp 99.5 F (37.5 C) (Temporal)   Ht 5\' 5"  (1.651 m)   Wt 135 lb (61.2 kg)   SpO2 97%   BMI 22.47 kg/m  Objective:   Physical Exam Cardiovascular:     Rate and Rhythm: Normal rate and regular rhythm.  Pulmonary:     Effort: Pulmonary effort is normal.     Breath sounds: Normal breath sounds. No wheezing or rales.  Abdominal:     General: Bowel sounds are normal.     Palpations: Abdomen is soft.     Tenderness: There is abdominal tenderness in the suprapubic area, left upper quadrant and left lower quadrant. There is no guarding.    Musculoskeletal:     Cervical back: Neck supple.  Skin:    General: Skin is warm and dry.  Neurological:     Mental Status: He is alert and oriented to person, place, and time.           Assessment & Plan:  Left lower quadrant abdominal pain Assessment & Plan: HPI and exam consistent with acute diverticulitis.  Do not suspect complication at this point, but will check CBC with diff.  Start Ciprofloxacin (Cipro) 500 mg 1 tablet by mouth 2 times a day for 10 days. Start Metronidazole (Flagyl) 500 mg 1 tablet by mouth 3 times a day for 10 days.  Can use OTC Tylenol as needed.  Recommended clear liquid diet now.  ED return precautions reviewed. Consider imaging if worsening of symptoms or no improvement.  Consider GI referral in for future flares. Will CC PCP.  Discussed starting liquid diet for 2-3 days if feeling onset of symptoms in the future.   Discussed working on diet and lifestyle changes.   Follow up with PCP on Monday next week.  I evaluated patient, was consulted regarding treatment, and agree with assessment and plan per Tenna Delaine, RN, DNP student.   Mayra Reel, NP-C   Orders: -     Ciprofloxacin HCl; Take 1 tablet (500 mg total) by mouth 2 (two) times daily for 10 days.  Dispense: 20 tablet; Refill: 0 -     metroNIDAZOLE; Take  1 tablet (500 mg total) by mouth 3 (three) times daily for 10 days.  Dispense: 30 tablet; Refill: 0 -     CBC with Differential/Platelet        Doreene Nest, NP

## 2023-02-26 NOTE — Assessment & Plan Note (Addendum)
HPI and exam consistent with acute diverticulitis.  Do not suspect complication at this point, but will check CBC with diff.  Start Ciprofloxacin (Cipro) 500 mg 1 tablet by mouth 2 times a day for 10 days. Start Metronidazole (Flagyl) 500 mg 1 tablet by mouth 3 times a day for 10 days.  Can use OTC Tylenol as needed.  Recommended clear liquid diet now.  ED return precautions reviewed. Consider imaging if worsening of symptoms or no improvement.  Consider GI referral in for future flares. Will CC PCP.  Discussed starting liquid diet for 2-3 days if feeling onset of symptoms in the future.  Discussed working on diet and lifestyle changes.   Follow up with PCP on Monday next week.  I evaluated patient, was consulted regarding treatment, and agree with assessment and plan per Tenna Delaine, RN, DNP student.   Mayra Reel, NP-C

## 2023-02-26 NOTE — Progress Notes (Signed)
Acute Office Visit  Subjective:     Patient ID: Joseph Elliott, male    DOB: Dec 04, 1980, 42 y.o.   MRN: [id number]  Chief Complaint  Patient presents with   Diverticulitis    Was diagnosed in July.  Sunday he noticed a flare up started. Pain in lower abdominal area    HPI  Alamin Sprunk is a 42 y.o. male patient of Mordecai Maes NP with a history of acute diverticulitis to the descending colon who presents today for abdominal pain.   He was evaluated in the ED on 12/09/22 for acute diverticulitis flare and was treated with sulfamethoxazole-trimethoprim 800-160 mg and metronidazole 500 mg.   2 days ago, he started having LLQ pain. He has experienced alternating constipation and diarrhea for 1 week. His last bowel movement was this morning but it was very small, firm, dry pellets after straining. He did have an episode of diarrhea last night. He does not take a stool softener. He stopped taking Omeprazole 1 month ago. 2 weeks ago he noticed a small amount of blood with his bowel movement. He has not noted any blood in his urine.   The LLQ pain comes and goes. When fully painful, it is 10/10 pain. At rest the pain is 2/10 and described as sore and aching. He has not tried any OTC remedies for the pain. After his ED visit in July 2024, he abstained from drinking and cut back on smoking. 1 month ago he began drinking 6 beers a night again and is smoking 2 PPD. He smokes marijuana 2 times per day.   He is on the road often with limited healthy options available. He consumes fried food frequently. He has 1 Red Bull per day, does not drink coffee, and drinks occasional soda. He is active at work.    Review of Systems  Constitutional:  Negative for chills and malaise/fatigue.  Respiratory:  Negative for cough and shortness of breath.   Cardiovascular:  Negative for chest pain.  Gastrointestinal:  Positive for abdominal pain, constipation and diarrhea. Negative for heartburn, nausea and vomiting.   Neurological:  Negative for dizziness and headaches.  Psychiatric/Behavioral:  Negative for depression. The patient is not nervous/anxious.       Objective:    BP 124/82   Pulse 70   Temp 99.5 F (37.5 C) (Temporal)   Ht 5\' 5"  (1.651 m)   Wt 135 lb (61.2 kg)   SpO2 97%   BMI 22.47 kg/m   BP Readings from Last 3 Encounters:  02/26/23 124/82  12/17/22 110/62  12/09/22 131/74   Wt Readings from Last 3 Encounters:  02/26/23 135 lb (61.2 kg)  12/17/22 136 lb (61.7 kg)  12/09/22 150 lb (68 kg)    Physical Exam Cardiovascular:     Rate and Rhythm: Normal rate and regular rhythm.  Pulmonary:     Effort: Pulmonary effort is normal.     Breath sounds: Normal breath sounds.  Abdominal:     General: Bowel sounds are normal.     Palpations: Abdomen is soft.     Tenderness: There is abdominal tenderness. There is no rebound.     Comments: LLQ tenderness  Skin:    General: Skin is warm and dry.  Neurological:     Mental Status: He is alert.    No results found for any visits on 02/26/23.     Assessment & Plan:   Problem List Items Addressed This Visit  Other   Left lower quadrant abdominal pain - Primary    HPI and exam consistent with acute diverticulitis.  Do not suspect complication at this point, but will check CBC with diff.  Start Ciprofloxacin (Cipro) 500 mg 1 tablet by mouth 2 times a day for 10 days. Start Metronidazole (Flagyl) 500 mg 1 tablet by mouth 3 times a day for 10 days.  Can use OTC Tylenol as needed.  Recommended clear liquid diet now.  ED return precautions reviewed. Consider imaging if worsening of symptoms or no improvement.  Consider GI referral in for future flares. Will CC PCP.  Discussed starting liquid diet for 2-3 days if feeling onset of symptoms in the future.  Discussed working on diet and lifestyle changes.   Follow up with PCP on Monday next week.  I evaluated patient, was consulted regarding treatment, and agree with  assessment and plan per Tenna Delaine, RN, DNP student.   Mayra Reel, NP-C       Relevant Medications   ciprofloxacin (CIPRO) 500 MG tablet   metroNIDAZOLE (FLAGYL) 500 MG tablet   Other Relevant Orders   CBC with Differential/Platelet    Meds ordered this encounter  Medications   ciprofloxacin (CIPRO) 500 MG tablet    Sig: Take 1 tablet (500 mg total) by mouth 2 (two) times daily for 10 days.    Dispense:  20 tablet    Refill:  0   metroNIDAZOLE (FLAGYL) 500 MG tablet    Sig: Take 1 tablet (500 mg total) by mouth 3 (three) times daily for 10 days.    Dispense:  30 tablet    Refill:  0    No follow-ups on file.  Benito Mccreedy, RN

## 2023-02-26 NOTE — Patient Instructions (Addendum)
Stop by the lab prior to leaving today. I will notify you of your results once received.   Start Ciprofloxacin (Cipro) 1 tablet by mouth 2 times a day for 10 days.  Start Metronidazole (Flagyl) 1 tablet by mouth 3 times a day for 10 days.  ER return precautions: if you have increased pain, significant worsening of symptoms, fevers, and no improvement go to the ED for evaluation.  Work on [employer] and making lifestyle changes.   If feeling onset of symptoms in the future, start a liquid diet for several days to see if this helps. If symptoms do not improve, make an appoint to be seen in our office.   Follow up with your PCP on Monday next week.

## 2023-03-10 ENCOUNTER — Other Ambulatory Visit: Payer: Self-pay | Admitting: Nurse Practitioner

## 2023-03-10 DIAGNOSIS — Z8249 Family history of ischemic heart disease and other diseases of the circulatory system: Secondary | ICD-10-CM

## 2023-03-10 DIAGNOSIS — E7800 Pure hypercholesterolemia, unspecified: Secondary | ICD-10-CM

## 2023-03-11 ENCOUNTER — Ambulatory Visit (INDEPENDENT_AMBULATORY_CARE_PROVIDER_SITE_OTHER): Payer: Managed Care, Other (non HMO) | Admitting: Nurse Practitioner

## 2023-03-11 ENCOUNTER — Encounter: Payer: Self-pay | Admitting: Nurse Practitioner

## 2023-03-11 VITALS — BP 126/88 | HR 71 | Temp 98.0°F | Ht 65.0 in | Wt 135.0 lb

## 2023-03-11 DIAGNOSIS — R1032 Left lower quadrant pain: Secondary | ICD-10-CM

## 2023-03-11 DIAGNOSIS — F1290 Cannabis use, unspecified, uncomplicated: Secondary | ICD-10-CM | POA: Diagnosis not present

## 2023-03-11 DIAGNOSIS — Z789 Other specified health status: Secondary | ICD-10-CM | POA: Diagnosis not present

## 2023-03-11 NOTE — Patient Instructions (Addendum)
Nice to see you today I want to see you in 6 months for your physical and labs, sooner if you need me  Work on the stuff we talked about in office

## 2023-03-11 NOTE — Progress Notes (Signed)
Established Patient Office Visit  Subjective   Patient ID: Joseph Elliott, male    DOB: [birth year]  Age: 42 y.o. MRN: [id number]  Chief Complaint  Patient presents with   Follow-up    Pt complains of still having slight stomach pain and nausea at night before bed.     HPI   Diverticulitis: patient was seen on 02/26/2023 by Mayra Reel for stomach pain that had started 2 days prior to the office visit. He had pain in the LLQ that radiated to the groin. He is here for a recheck. States that he finished the antibiotic yesterday. States that he is having some diarrhea. States that he was having some nausea that has been intermittent. No blood in the stool. States that he got hot and cold but no fever. States that he is eating ok. States that he did try. State that he drinking 6 bottles a water a day. He has avoided soda. States that he is drinking up to 6 beers a night. States that he was watching the game and had 4-6 beers last night.     Review of Systems  Constitutional:  Negative for chills and fever.  Respiratory:  Negative for shortness of breath.   Cardiovascular:  Negative for chest pain.  Gastrointestinal:  Positive for diarrhea and nausea. Negative for abdominal pain, constipation and vomiting.  Neurological:  Negative for headaches.      Objective:     BP 126/88   Pulse 71   Temp 98 F (36.7 C) (Oral)   Ht 5\' 5"  (1.651 m)   Wt 135 lb (61.2 kg)   SpO2 92%   BMI 22.47 kg/m  BP Readings from Last 3 Encounters:  03/11/23 126/88  02/26/23 124/82  12/17/22 110/62   Wt Readings from Last 3 Encounters:  03/11/23 135 lb (61.2 kg)  02/26/23 135 lb (61.2 kg)  12/17/22 136 lb (61.7 kg)      Physical Exam Vitals and nursing note reviewed.  Constitutional:      Appearance: Normal appearance.  Cardiovascular:     Rate and Rhythm: Normal rate and regular rhythm.     Heart sounds: Normal heart sounds.  Pulmonary:     Effort: Pulmonary effort is normal.     Breath  sounds: Normal breath sounds.  Abdominal:     General: Bowel sounds are normal. There is no distension.     Palpations: There is no mass.     Tenderness: There is no abdominal tenderness.     Hernia: No hernia is present.  Neurological:     Mental Status: He is alert.      No results found for any visits on 03/11/23.    The 10-year ASCVD risk score (Arnett DK, et al., 2019) is: 2.3%    Assessment & Plan:   Problem List Items Addressed This Visit       Other   Alcohol use    Patient still drinking 2-6 beers at night.  Did inform him with the Flagyl use this caused him to be sick.  Also reviewed the medical recommendation is no more than 2 drinks a day for him.  Encourage patient to wean down on alcohol use      Left lower quadrant abdominal pain - Primary    Was evaluated and treated for presumed diverticulitis.  Patient has finished the antibiotic treatments.  No pain on palpation today.      Marijuana use    This is more of  a CBD/delta product use.  Patient states he is going to try to lean more on it versus alcohol.  Did discuss with patient that although the delta products are legal the body treats it the same as THC.  This can increase his risk for cyclic vomiting syndrome.       Return in about 6 months (around 09/09/2023) for CPE and Labs.    Audria Nine, NP

## 2023-03-11 NOTE — Assessment & Plan Note (Signed)
Was evaluated and treated for presumed diverticulitis.  Patient has finished the antibiotic treatments.  No pain on palpation today.

## 2023-03-11 NOTE — Assessment & Plan Note (Signed)
Patient still drinking 2-6 beers at night.  Did inform him with the Flagyl use this caused him to be sick.  Also reviewed the medical recommendation is no more than 2 drinks a day for him.  Encourage patient to wean down on alcohol use

## 2023-03-11 NOTE — Assessment & Plan Note (Signed)
This is more of a CBD/delta product use.  Patient states he is going to try to lean more on it versus alcohol.  Did discuss with patient that although the delta products are legal the body treats it the same as THC.  This can increase his risk for cyclic vomiting syndrome.

## 2023-06-06 ENCOUNTER — Encounter: Payer: Self-pay | Admitting: Nurse Practitioner

## 2023-06-06 NOTE — Telephone Encounter (Signed)
 Called patient states that the numbness in legs only 10-15 seconds. Back pain is lower back pain into b/l buttock pain. Right now his pain is sharp and at 8/10. When he is sitting still it gets a little better but increases with any movement. Denies any bowel or bladder changes. Denies any urinary symptoms. He is taking tylenol  1000 mg daily has only very mild improvement with symptoms. He has not tried heat or ice. Denies any injury or history of pain in the past. Have reviewed red words if any he will go to ED. We do not have anything open until next week. Do you want patient to be seen at urgent care?

## 2023-06-06 NOTE — Telephone Encounter (Signed)
 If we do not have an opening or our sister clinic does not then urgent care is the best option

## 2023-06-07 NOTE — Telephone Encounter (Signed)
 Called patient we did not have any opening that fit his schedule. He will go to walk in if no improvement. He will call our office if any new symptoms or changes.

## 2023-10-17 ENCOUNTER — Other Ambulatory Visit: Payer: Self-pay | Admitting: Nurse Practitioner

## 2023-10-17 DIAGNOSIS — Z8249 Family history of ischemic heart disease and other diseases of the circulatory system: Secondary | ICD-10-CM

## 2023-10-17 DIAGNOSIS — E7800 Pure hypercholesterolemia, unspecified: Secondary | ICD-10-CM

## 2023-10-17 NOTE — Telephone Encounter (Signed)
 lvm for pt to call office to schedule appt.

## 2023-10-17 NOTE — Telephone Encounter (Signed)
 Patient is over due for a CPE. Can we get them scheduled in 30 days for a CPE please

## 2024-01-12 ENCOUNTER — Other Ambulatory Visit: Payer: Self-pay | Admitting: Nurse Practitioner

## 2024-01-12 DIAGNOSIS — E7800 Pure hypercholesterolemia, unspecified: Secondary | ICD-10-CM

## 2024-01-12 DIAGNOSIS — Z8249 Family history of ischemic heart disease and other diseases of the circulatory system: Secondary | ICD-10-CM

## 2024-01-13 NOTE — Telephone Encounter (Signed)
 Unable to leave vm, sent mychart message.

## 2024-01-13 NOTE — Telephone Encounter (Signed)
 Needs CPE in the next 30 days to continue getting refills

## 2024-01-14 NOTE — Telephone Encounter (Signed)
 Lvmtcb

## 2024-01-15 ENCOUNTER — Encounter: Payer: Self-pay | Admitting: Nurse Practitioner

## 2024-01-15 NOTE — Telephone Encounter (Signed)
 Lvmtcb. 3rd attempt - mailed letter

## 2024-01-28 ENCOUNTER — Encounter: Payer: Self-pay | Admitting: Nurse Practitioner
